# Patient Record
Sex: Male | Born: 1960
Health system: Southern US, Community
[De-identification: ages and names within clinical notes are randomized; demographics above are authoritative.]

## PROBLEM LIST (undated history)

## (undated) DIAGNOSIS — T7840XA Allergy, unspecified, initial encounter: Secondary | ICD-10-CM

## (undated) DIAGNOSIS — E079 Disorder of thyroid, unspecified: Secondary | ICD-10-CM

## (undated) DIAGNOSIS — G4730 Sleep apnea, unspecified: Secondary | ICD-10-CM

## (undated) DIAGNOSIS — E119 Type 2 diabetes mellitus without complications: Secondary | ICD-10-CM

## (undated) DIAGNOSIS — C6290 Malignant neoplasm of unspecified testis, unspecified whether descended or undescended: Secondary | ICD-10-CM

## (undated) HISTORY — DX: Disorder of thyroid, unspecified: E07.9

## (undated) HISTORY — PX: TESTICLE REMOVAL: SHX68

## (undated) HISTORY — PX: POLYPECTOMY: SHX149

## (undated) HISTORY — DX: Sleep apnea, unspecified: G47.30

## (undated) HISTORY — DX: Allergy, unspecified, initial encounter: T78.40XA

## (undated) HISTORY — DX: Type 2 diabetes mellitus without complications: E11.9

## (undated) HISTORY — DX: Malignant neoplasm of unspecified testis, unspecified whether descended or undescended: C62.90

## (undated) HISTORY — PX: WISDOM TOOTH EXTRACTION: SHX21

---

## 2003-04-14 ENCOUNTER — Encounter: Payer: Self-pay | Admitting: *Deleted

## 2003-04-14 ENCOUNTER — Ambulatory Visit (HOSPITAL_COMMUNITY): Admission: RE | Admit: 2003-04-14 | Discharge: 2003-04-14 | Payer: Self-pay | Admitting: *Deleted

## 2003-10-31 DIAGNOSIS — C6290 Malignant neoplasm of unspecified testis, unspecified whether descended or undescended: Secondary | ICD-10-CM

## 2003-10-31 HISTORY — DX: Malignant neoplasm of unspecified testis, unspecified whether descended or undescended: C62.90

## 2005-11-03 ENCOUNTER — Ambulatory Visit (HOSPITAL_COMMUNITY): Admission: RE | Admit: 2005-11-03 | Discharge: 2005-11-03 | Payer: Self-pay | Admitting: Urology

## 2005-11-03 ENCOUNTER — Encounter (INDEPENDENT_AMBULATORY_CARE_PROVIDER_SITE_OTHER): Payer: Self-pay | Admitting: Specialist

## 2005-11-10 ENCOUNTER — Ambulatory Visit: Payer: Self-pay | Admitting: Oncology

## 2005-11-15 ENCOUNTER — Ambulatory Visit: Admission: RE | Admit: 2005-11-15 | Discharge: 2006-01-10 | Payer: Self-pay | Admitting: *Deleted

## 2006-01-04 ENCOUNTER — Ambulatory Visit: Payer: Self-pay | Admitting: Oncology

## 2006-01-05 ENCOUNTER — Ambulatory Visit: Payer: Self-pay | Admitting: Internal Medicine

## 2006-02-02 ENCOUNTER — Ambulatory Visit (HOSPITAL_BASED_OUTPATIENT_CLINIC_OR_DEPARTMENT_OTHER): Admission: RE | Admit: 2006-02-02 | Discharge: 2006-02-02 | Payer: Self-pay | Admitting: Internal Medicine

## 2006-02-02 ENCOUNTER — Encounter: Payer: Self-pay | Admitting: Internal Medicine

## 2006-02-11 ENCOUNTER — Ambulatory Visit: Payer: Self-pay | Admitting: Internal Medicine

## 2006-04-03 ENCOUNTER — Ambulatory Visit (HOSPITAL_COMMUNITY): Admission: RE | Admit: 2006-04-03 | Discharge: 2006-04-03 | Payer: Self-pay | Admitting: Oncology

## 2006-04-18 ENCOUNTER — Ambulatory Visit: Payer: Self-pay | Admitting: Oncology

## 2006-04-18 ENCOUNTER — Ambulatory Visit: Payer: Self-pay | Admitting: Internal Medicine

## 2006-04-20 LAB — CBC WITH DIFFERENTIAL/PLATELET
Basophils Absolute: 0 10*3/uL (ref 0.0–0.1)
Eosinophils Absolute: 0.1 10*3/uL (ref 0.0–0.5)
HGB: 13.7 g/dL (ref 13.0–17.1)
NEUT#: 3 10*3/uL (ref 1.5–6.5)
RBC: 4.47 10*6/uL (ref 4.20–5.71)
RDW: 11.1 % — ABNORMAL LOW (ref 11.2–14.6)
WBC: 4.4 10*3/uL (ref 4.0–10.0)
lymph#: 1 10*3/uL (ref 0.9–3.3)

## 2006-04-23 LAB — COMPREHENSIVE METABOLIC PANEL
AST: 16 U/L (ref 0–37)
Albumin: 4.2 g/dL (ref 3.5–5.2)
Alkaline Phosphatase: 74 U/L (ref 39–117)
BUN: 19 mg/dL (ref 6–23)
Calcium: 9.3 mg/dL (ref 8.4–10.5)
Chloride: 106 mEq/L (ref 96–112)
Glucose, Bld: 113 mg/dL — ABNORMAL HIGH (ref 70–99)
Potassium: 4.3 mEq/L (ref 3.5–5.3)
Sodium: 141 mEq/L (ref 135–145)
Total Protein: 7.1 g/dL (ref 6.0–8.3)

## 2006-04-23 LAB — AFP TUMOR MARKER: AFP-Tumor Marker: 2.3 ng/mL (ref 0.0–8.0)

## 2006-06-08 ENCOUNTER — Ambulatory Visit: Payer: Self-pay | Admitting: Internal Medicine

## 2006-07-18 ENCOUNTER — Ambulatory Visit: Payer: Self-pay | Admitting: Oncology

## 2006-07-20 LAB — CBC WITH DIFFERENTIAL/PLATELET
BASO%: 0.3 % (ref 0.0–2.0)
EOS%: 1.5 % (ref 0.0–7.0)
HCT: 38 % — ABNORMAL LOW (ref 38.7–49.9)
LYMPH%: 18.5 % (ref 14.0–48.0)
MCH: 29.9 pg (ref 28.0–33.4)
MCHC: 34.6 g/dL (ref 32.0–35.9)
NEUT%: 70.3 % (ref 40.0–75.0)
Platelets: 217 10*3/uL (ref 145–400)

## 2006-07-22 LAB — COMPREHENSIVE METABOLIC PANEL
ALT: 24 U/L (ref 0–40)
AST: 16 U/L (ref 0–37)
Creatinine, Ser: 1.15 mg/dL (ref 0.40–1.50)
Total Bilirubin: 0.7 mg/dL (ref 0.3–1.2)

## 2006-07-22 LAB — LACTATE DEHYDROGENASE: LDH: 131 U/L (ref 94–250)

## 2006-10-12 ENCOUNTER — Ambulatory Visit (HOSPITAL_COMMUNITY): Admission: RE | Admit: 2006-10-12 | Discharge: 2006-10-12 | Payer: Self-pay | Admitting: Oncology

## 2006-10-12 ENCOUNTER — Ambulatory Visit: Payer: Self-pay | Admitting: Oncology

## 2006-10-16 LAB — CBC WITH DIFFERENTIAL/PLATELET
BASO%: 1 % (ref 0.0–2.0)
EOS%: 1.7 % (ref 0.0–7.0)
LYMPH%: 17.2 % (ref 14.0–48.0)
MCH: 29.3 pg (ref 28.0–33.4)
MCHC: 34.5 g/dL (ref 32.0–35.9)
MONO#: 0.5 10*3/uL (ref 0.1–0.9)
NEUT%: 71.1 % (ref 40.0–75.0)
RBC: 4.67 10*6/uL (ref 4.20–5.71)
WBC: 5 10*3/uL (ref 4.0–10.0)
lymph#: 0.9 10*3/uL (ref 0.9–3.3)

## 2006-10-18 LAB — LACTATE DEHYDROGENASE: LDH: 122 U/L (ref 94–250)

## 2006-10-18 LAB — COMPREHENSIVE METABOLIC PANEL
ALT: 25 U/L (ref 0–53)
AST: 19 U/L (ref 0–37)
CO2: 24 mEq/L (ref 19–32)
Chloride: 106 mEq/L (ref 96–112)
Creatinine, Ser: 1.11 mg/dL (ref 0.40–1.50)
Sodium: 140 mEq/L (ref 135–145)
Total Bilirubin: 0.7 mg/dL (ref 0.3–1.2)
Total Protein: 7.4 g/dL (ref 6.0–8.3)

## 2006-11-16 ENCOUNTER — Ambulatory Visit: Payer: Self-pay | Admitting: Internal Medicine

## 2006-11-16 LAB — CONVERTED CEMR LAB
Albumin: 3.6 g/dL (ref 3.5–5.2)
BUN: 17 mg/dL (ref 6–23)
Basophils Absolute: 0 10*3/uL (ref 0.0–0.1)
Cholesterol: 166 mg/dL (ref 0–200)
Eosinophils Relative: 1.5 % (ref 0.0–5.0)
GFR calc Af Amer: 93 mL/min
GFR calc non Af Amer: 77 mL/min
HCT: 39.5 % (ref 39.0–52.0)
Hgb A1c MFr Bld: 6.1 % — ABNORMAL HIGH (ref 4.6–6.0)
Lymphocytes Relative: 18 % (ref 12.0–46.0)
MCHC: 33.4 g/dL (ref 30.0–36.0)
MCV: 88 fL (ref 78.0–100.0)
Neutro Abs: 2.9 10*3/uL (ref 1.4–7.7)
Neutrophils Relative %: 71.6 % (ref 43.0–77.0)
Potassium: 4 meq/L (ref 3.5–5.1)
RBC: 4.49 M/uL (ref 4.22–5.81)
Sodium: 142 meq/L (ref 135–145)
Total CHOL/HDL Ratio: 4.8
Triglycerides: 80 mg/dL (ref 0–149)
WBC: 4.6 10*3/uL (ref 4.5–10.5)

## 2006-12-14 ENCOUNTER — Ambulatory Visit: Payer: Self-pay | Admitting: Internal Medicine

## 2007-01-15 ENCOUNTER — Ambulatory Visit: Payer: Self-pay | Admitting: Oncology

## 2007-01-31 LAB — CBC WITH DIFFERENTIAL/PLATELET
Eosinophils Absolute: 0.1 10*3/uL (ref 0.0–0.5)
MONO#: 0.5 10*3/uL (ref 0.1–0.9)
NEUT#: 4.4 10*3/uL (ref 1.5–6.5)
Platelets: 198 10*3/uL (ref 145–400)
RBC: 4.45 10*6/uL (ref 4.20–5.71)
RDW: 12.6 % (ref 11.2–14.6)
WBC: 5.9 10*3/uL (ref 4.0–10.0)

## 2007-02-01 ENCOUNTER — Ambulatory Visit: Payer: Self-pay | Admitting: Internal Medicine

## 2007-02-03 LAB — COMPREHENSIVE METABOLIC PANEL
ALT: 26 U/L (ref 0–53)
AST: 16 U/L (ref 0–37)
BUN: 24 mg/dL — ABNORMAL HIGH (ref 6–23)
Calcium: 9 mg/dL (ref 8.4–10.5)
Chloride: 106 mEq/L (ref 96–112)
Creatinine, Ser: 1.15 mg/dL (ref 0.40–1.50)
Total Bilirubin: 0.7 mg/dL (ref 0.3–1.2)

## 2007-02-03 LAB — LACTATE DEHYDROGENASE: LDH: 124 U/L (ref 94–250)

## 2007-02-03 LAB — BETA HCG QUANT (REF LAB): Beta hCG, Tumor Marker: <0.5 m[IU]/mL (ref <5.0)

## 2007-04-25 ENCOUNTER — Ambulatory Visit (HOSPITAL_COMMUNITY): Admission: RE | Admit: 2007-04-25 | Discharge: 2007-04-25 | Payer: Self-pay | Admitting: Oncology

## 2007-05-08 ENCOUNTER — Ambulatory Visit: Payer: Self-pay | Admitting: Oncology

## 2007-05-10 LAB — CBC WITH DIFFERENTIAL/PLATELET
BASO%: 0.5 % (ref 0.0–2.0)
Eosinophils Absolute: 0.1 10*3/uL (ref 0.0–0.5)
MCHC: 34.8 g/dL (ref 32.0–35.9)
MONO#: 0.5 10*3/uL (ref 0.1–0.9)
NEUT#: 4.1 10*3/uL (ref 1.5–6.5)
RBC: 4.43 10*6/uL (ref 4.20–5.71)
WBC: 5.6 10*3/uL (ref 4.0–10.0)
lymph#: 0.9 10*3/uL (ref 0.9–3.3)

## 2007-05-13 LAB — BETA HCG QUANT (REF LAB): Beta hCG, Tumor Marker: <0.5 m[IU]/mL (ref <5.0)

## 2007-05-13 LAB — COMPREHENSIVE METABOLIC PANEL
BUN: 22 mg/dL (ref 6–23)
CO2: 24 mEq/L (ref 19–32)
Calcium: 8.9 mg/dL (ref 8.4–10.5)
Chloride: 105 mEq/L (ref 96–112)
Creatinine, Ser: 1.12 mg/dL (ref 0.40–1.50)

## 2007-05-13 LAB — LACTATE DEHYDROGENASE: LDH: 118 U/L (ref 94–250)

## 2007-11-08 ENCOUNTER — Ambulatory Visit (HOSPITAL_COMMUNITY): Admission: RE | Admit: 2007-11-08 | Discharge: 2007-11-08 | Payer: Self-pay | Admitting: Oncology

## 2007-11-13 ENCOUNTER — Ambulatory Visit: Payer: Self-pay | Admitting: Oncology

## 2007-11-15 LAB — CBC WITH DIFFERENTIAL/PLATELET
BASO%: 0.3 % (ref 0.0–2.0)
LYMPH%: 15.6 % (ref 14.0–48.0)
MCHC: 34 g/dL (ref 32.0–35.9)
MONO#: 0.4 10*3/uL (ref 0.1–0.9)
RBC: 4.6 10*6/uL (ref 4.20–5.71)
RDW: 10.8 % — ABNORMAL LOW (ref 11.2–14.6)
WBC: 5.3 10*3/uL (ref 4.0–10.0)
lymph#: 0.8 10*3/uL — ABNORMAL LOW (ref 0.9–3.3)

## 2007-11-17 LAB — BETA HCG QUANT (REF LAB): Beta hCG, Tumor Marker: <0.5 m[IU]/mL (ref <5.0)

## 2007-11-17 LAB — COMPREHENSIVE METABOLIC PANEL
ALT: 26 U/L (ref 0–53)
CO2: 26 mEq/L (ref 19–32)
Chloride: 105 mEq/L (ref 96–112)
Potassium: 4 mEq/L (ref 3.5–5.3)
Sodium: 141 mEq/L (ref 135–145)
Total Bilirubin: 0.6 mg/dL (ref 0.3–1.2)
Total Protein: 7.2 g/dL (ref 6.0–8.3)

## 2007-11-17 LAB — AFP TUMOR MARKER: AFP-Tumor Marker: 1.5 ng/mL (ref 0.0–8.0)

## 2008-01-06 ENCOUNTER — Ambulatory Visit: Payer: Self-pay | Admitting: Internal Medicine

## 2008-01-06 DIAGNOSIS — Z8547 Personal history of malignant neoplasm of testis: Secondary | ICD-10-CM

## 2008-01-06 DIAGNOSIS — R7309 Other abnormal glucose: Secondary | ICD-10-CM

## 2008-01-06 DIAGNOSIS — G4733 Obstructive sleep apnea (adult) (pediatric): Secondary | ICD-10-CM

## 2008-01-06 DIAGNOSIS — M25519 Pain in unspecified shoulder: Secondary | ICD-10-CM

## 2008-01-15 ENCOUNTER — Encounter: Payer: Self-pay | Admitting: Internal Medicine

## 2008-01-29 ENCOUNTER — Encounter: Payer: Self-pay | Admitting: Internal Medicine

## 2008-03-06 ENCOUNTER — Ambulatory Visit: Payer: Self-pay | Admitting: Internal Medicine

## 2008-04-15 ENCOUNTER — Ambulatory Visit: Payer: Self-pay | Admitting: Oncology

## 2008-04-17 ENCOUNTER — Ambulatory Visit (HOSPITAL_COMMUNITY): Admission: RE | Admit: 2008-04-17 | Discharge: 2008-04-17 | Payer: Self-pay | Admitting: Oncology

## 2008-04-24 LAB — CBC WITH DIFFERENTIAL/PLATELET
Basophils Absolute: 0 10*3/uL (ref 0.0–0.1)
EOS%: 1 % (ref 0.0–7.0)
HGB: 13.6 g/dL (ref 13.0–17.1)
MCH: 29.4 pg (ref 28.0–33.4)
MCV: 84.9 fL (ref 81.6–98.0)
MONO%: 5.6 % (ref 0.0–13.0)
NEUT%: 78.8 % — ABNORMAL HIGH (ref 40.0–75.0)
RDW: 13.2 % (ref 11.2–14.6)

## 2008-04-26 LAB — COMPREHENSIVE METABOLIC PANEL
ALT: 27 U/L (ref 0–53)
Albumin: 4.2 g/dL (ref 3.5–5.2)
Calcium: 9 mg/dL (ref 8.4–10.5)
Potassium: 4 mEq/L (ref 3.5–5.3)
Total Bilirubin: 0.7 mg/dL (ref 0.3–1.2)
Total Protein: 7 g/dL (ref 6.0–8.3)

## 2008-04-26 LAB — AFP TUMOR MARKER: AFP-Tumor Marker: 2.3 ng/mL (ref 0.0–8.0)

## 2008-04-26 LAB — BETA HCG QUANT (REF LAB): Beta hCG, Tumor Marker: <0.5 m[IU]/mL (ref <5.0)

## 2008-04-26 LAB — LACTATE DEHYDROGENASE: LDH: 148 U/L (ref 94–250)

## 2008-05-15 ENCOUNTER — Encounter: Payer: Self-pay | Admitting: Internal Medicine

## 2008-05-29 ENCOUNTER — Telehealth: Payer: Self-pay | Admitting: Internal Medicine

## 2008-09-30 ENCOUNTER — Ambulatory Visit: Payer: Self-pay | Admitting: Oncology

## 2008-10-02 LAB — CBC WITH DIFFERENTIAL/PLATELET
BASO%: 0.3 % (ref 0.0–2.0)
EOS%: 1.9 % (ref 0.0–7.0)
MCH: 29.8 pg (ref 28.0–33.4)
MCHC: 34.5 g/dL (ref 32.0–35.9)
RBC: 4.69 10*6/uL (ref 4.20–5.71)
RDW: 13.2 % (ref 11.2–14.6)
lymph#: 0.8 10*3/uL — ABNORMAL LOW (ref 0.9–3.3)

## 2008-10-06 LAB — COMPREHENSIVE METABOLIC PANEL
ALT: 21 U/L (ref 0–53)
AST: 15 U/L (ref 0–37)
Albumin: 4.3 g/dL (ref 3.5–5.2)
Calcium: 9.2 mg/dL (ref 8.4–10.5)
Chloride: 105 mEq/L (ref 96–112)
Potassium: 4.1 mEq/L (ref 3.5–5.3)

## 2009-01-07 ENCOUNTER — Ambulatory Visit: Payer: Self-pay | Admitting: Internal Medicine

## 2009-01-07 DIAGNOSIS — J069 Acute upper respiratory infection, unspecified: Secondary | ICD-10-CM | POA: Insufficient documentation

## 2009-04-07 ENCOUNTER — Ambulatory Visit: Payer: Self-pay | Admitting: Oncology

## 2009-04-09 ENCOUNTER — Ambulatory Visit (HOSPITAL_COMMUNITY): Admission: RE | Admit: 2009-04-09 | Discharge: 2009-04-09 | Payer: Self-pay | Admitting: Oncology

## 2009-04-09 LAB — CBC WITH DIFFERENTIAL/PLATELET
BASO%: 0.2 % (ref 0.0–2.0)
Basophils Absolute: 0 10*3/uL (ref 0.0–0.1)
EOS%: 1.3 % (ref 0.0–7.0)
MCH: 30.2 pg (ref 27.2–33.4)
MCHC: 35 g/dL (ref 32.0–36.0)
MCV: 86.3 fL (ref 79.3–98.0)
MONO%: 6.4 % (ref 0.0–14.0)
RBC: 4.7 10*6/uL (ref 4.20–5.82)
RDW: 13.7 % (ref 11.0–14.6)
lymph#: 0.9 10*3/uL (ref 0.9–3.3)

## 2009-04-11 LAB — COMPREHENSIVE METABOLIC PANEL
ALT: 26 U/L (ref 0–53)
AST: 17 U/L (ref 0–37)
Albumin: 3.9 g/dL (ref 3.5–5.2)
Alkaline Phosphatase: 70 U/L (ref 39–117)
BUN: 17 mg/dL (ref 6–23)
Potassium: 4.4 mEq/L (ref 3.5–5.3)
Sodium: 142 mEq/L (ref 135–145)

## 2009-04-11 LAB — AFP TUMOR MARKER: AFP-Tumor Marker: 2.9 ng/mL (ref 0.0–8.0)

## 2009-04-11 LAB — BETA HCG QUANT (REF LAB): Beta hCG, Tumor Marker: <0.5 m[IU]/mL (ref <5.0)

## 2009-04-28 ENCOUNTER — Ambulatory Visit (HOSPITAL_COMMUNITY): Admission: RE | Admit: 2009-04-28 | Discharge: 2009-04-28 | Payer: Self-pay | Admitting: Oncology

## 2009-10-13 ENCOUNTER — Ambulatory Visit: Payer: Self-pay | Admitting: Oncology

## 2009-10-15 LAB — CBC WITH DIFFERENTIAL/PLATELET
EOS%: 1.4 % (ref 0.0–7.0)
Eosinophils Absolute: 0.1 10*3/uL (ref 0.0–0.5)
MCV: 88.3 fL (ref 79.3–98.0)
MONO%: 9.1 % (ref 0.0–14.0)
NEUT#: 4.7 10*3/uL (ref 1.5–6.5)
RBC: 4.46 10*6/uL (ref 4.20–5.82)
RDW: 13.2 % (ref 11.0–14.6)
WBC: 6.5 10*3/uL (ref 4.0–10.3)
lymph#: 1.1 10*3/uL (ref 0.9–3.3)

## 2009-10-17 LAB — AFP TUMOR MARKER: AFP-Tumor Marker: 2.6 ng/mL (ref 0.0–8.0)

## 2009-10-17 LAB — COMPREHENSIVE METABOLIC PANEL
AST: 14 U/L (ref 0–37)
Albumin: 4.1 g/dL (ref 3.5–5.2)
Alkaline Phosphatase: 78 U/L (ref 39–117)
Glucose, Bld: 160 mg/dL — ABNORMAL HIGH (ref 70–99)
Potassium: 4 mEq/L (ref 3.5–5.3)
Sodium: 139 mEq/L (ref 135–145)
Total Protein: 7.4 g/dL (ref 6.0–8.3)

## 2009-10-17 LAB — BETA HCG QUANT (REF LAB): Beta hCG, Tumor Marker: <0.5 m[IU]/mL (ref <5.0)

## 2010-01-20 ENCOUNTER — Ambulatory Visit: Payer: Self-pay | Admitting: Vascular Surgery

## 2010-04-08 ENCOUNTER — Ambulatory Visit (HOSPITAL_COMMUNITY): Admission: RE | Admit: 2010-04-08 | Discharge: 2010-04-08 | Payer: Self-pay | Admitting: Oncology

## 2010-04-13 ENCOUNTER — Ambulatory Visit: Payer: Self-pay | Admitting: Oncology

## 2010-04-15 LAB — CBC WITH DIFFERENTIAL/PLATELET
Basophils Absolute: 0 10*3/uL (ref 0.0–0.1)
Eosinophils Absolute: 0.1 10*3/uL (ref 0.0–0.5)
HCT: 38.5 % (ref 38.4–49.9)
HGB: 13.1 g/dL (ref 13.0–17.1)
LYMPH%: 20.4 % (ref 14.0–49.0)
MCH: 29.5 pg (ref 27.2–33.4)
MCV: 86.6 fL (ref 79.3–98.0)
MONO%: 11.4 % (ref 0.0–14.0)
NEUT#: 3.6 10*3/uL (ref 1.5–6.5)
NEUT%: 66.2 % (ref 39.0–75.0)
Platelets: 219 10*3/uL (ref 140–400)
RDW: 13.8 % (ref 11.0–14.6)

## 2010-04-20 LAB — COMPREHENSIVE METABOLIC PANEL
Albumin: 4 g/dL (ref 3.5–5.2)
Alkaline Phosphatase: 79 U/L (ref 39–117)
BUN: 20 mg/dL (ref 6–23)
Creatinine, Ser: 1.02 mg/dL (ref 0.40–1.50)
Glucose, Bld: 130 mg/dL — ABNORMAL HIGH (ref 70–99)
Potassium: 4.3 mEq/L (ref 3.5–5.3)

## 2010-04-20 LAB — BETA HCG QUANT (REF LAB): Beta hCG, Tumor Marker: <0.5 m[IU]/mL (ref <5.0)

## 2010-04-20 LAB — AFP TUMOR MARKER: AFP-Tumor Marker: 2 ng/mL (ref 0.0–8.0)

## 2010-10-12 ENCOUNTER — Ambulatory Visit: Payer: Self-pay | Admitting: Oncology

## 2010-10-20 LAB — CBC WITH DIFFERENTIAL/PLATELET
Basophils Absolute: 0 10*3/uL (ref 0.0–0.1)
EOS%: 1.8 % (ref 0.0–7.0)
Eosinophils Absolute: 0.1 10*3/uL (ref 0.0–0.5)
HCT: 39.7 % (ref 38.4–49.9)
HGB: 13.7 g/dL (ref 13.0–17.1)
MCH: 29.9 pg (ref 27.2–33.4)
MONO#: 0.4 10*3/uL (ref 0.1–0.9)
NEUT#: 3 10*3/uL (ref 1.5–6.5)
NEUT%: 64.5 % (ref 39.0–75.0)
RDW: 13.4 % (ref 11.0–14.6)
lymph#: 1.1 10*3/uL (ref 0.9–3.3)

## 2010-10-25 LAB — COMPREHENSIVE METABOLIC PANEL
AST: 23 U/L (ref 0–37)
Albumin: 4.1 g/dL (ref 3.5–5.2)
BUN: 18 mg/dL (ref 6–23)
CO2: 22 mEq/L (ref 19–32)
Calcium: 9.3 mg/dL (ref 8.4–10.5)
Chloride: 103 mEq/L (ref 96–112)
Creatinine, Ser: 1.01 mg/dL (ref 0.40–1.50)
Glucose, Bld: 152 mg/dL — ABNORMAL HIGH (ref 70–99)
Potassium: 4.1 mEq/L (ref 3.5–5.3)

## 2010-10-25 LAB — AFP TUMOR MARKER: AFP-Tumor Marker: 3.1 ng/mL (ref 0.0–8.0)

## 2010-10-25 LAB — BETA HCG QUANT (REF LAB): Beta hCG, Tumor Marker: 32.3 m[IU]/mL — ABNORMAL HIGH (ref <5.0)

## 2010-10-25 LAB — LACTATE DEHYDROGENASE: LDH: 115 U/L (ref 94–250)

## 2010-10-28 ENCOUNTER — Ambulatory Visit (HOSPITAL_COMMUNITY)
Admission: RE | Admit: 2010-10-28 | Discharge: 2010-10-28 | Payer: Self-pay | Source: Home / Self Care | Attending: Oncology | Admitting: Oncology

## 2010-11-20 ENCOUNTER — Encounter: Payer: Self-pay | Admitting: *Deleted

## 2011-02-20 ENCOUNTER — Encounter (HOSPITAL_BASED_OUTPATIENT_CLINIC_OR_DEPARTMENT_OTHER): Payer: BC Managed Care – PPO | Admitting: Oncology

## 2011-02-20 ENCOUNTER — Other Ambulatory Visit: Payer: Self-pay | Admitting: Oncology

## 2011-02-20 DIAGNOSIS — C6290 Malignant neoplasm of unspecified testis, unspecified whether descended or undescended: Secondary | ICD-10-CM

## 2011-02-20 LAB — CBC WITH DIFFERENTIAL/PLATELET
BASO%: 0 % (ref 0.0–2.0)
EOS%: 1.4 % (ref 0.0–7.0)
HCT: 39.9 % (ref 38.4–49.9)
LYMPH%: 15.5 % (ref 14.0–49.0)
MCH: 29.3 pg (ref 27.2–33.4)
MCHC: 33.5 g/dL (ref 32.0–36.0)
MONO#: 0.5 10*3/uL (ref 0.1–0.9)
NEUT%: 75.4 % — ABNORMAL HIGH (ref 39.0–75.0)
RBC: 4.56 10*6/uL (ref 4.20–5.82)
WBC: 7 10*3/uL (ref 4.0–10.3)
lymph#: 1.1 10*3/uL (ref 0.9–3.3)

## 2011-02-20 LAB — COMPREHENSIVE METABOLIC PANEL
ALT: 20 U/L (ref 0–53)
AST: 20 U/L (ref 0–37)
Chloride: 108 mEq/L (ref 96–112)
Creatinine, Ser: 1.06 mg/dL (ref 0.40–1.50)
Sodium: 140 mEq/L (ref 135–145)
Total Bilirubin: 0.7 mg/dL (ref 0.3–1.2)
Total Protein: 7.4 g/dL (ref 6.0–8.3)

## 2011-02-22 LAB — AFP TUMOR MARKER: AFP-Tumor Marker: 2.5 ng/mL (ref 0.0–8.0)

## 2011-02-22 LAB — BETA HCG QUANT (REF LAB): Beta hCG, Tumor Marker: <0.5 m[IU]/mL (ref <5.0)

## 2011-03-14 NOTE — Procedures (Signed)
DUPLEX DEEP VENOUS EXAM - LOWER EXTREMITY   INDICATION:  Left lower extremity pain and swelling since Monday after a  long drive this weekend.   HISTORY:  Edema:  Left lower extremity.  Trauma/Surgery:  No.  Pain:  Left lower extremity.  PE:  No.  Previous DVT:  No.  Anticoagulants:  Had shot yesterday, per patient.  Other:   DUPLEX EXAM:                CFV   SFV   PopV  PTV    GSV                R  L  R  L  R  L  R   L  R  L  Thrombosis    o  o     o     o      o     o  Spontaneous   +  +     +     +      +     +  Phasic        +  +     +     +      +     +  Augmentation  +  +     +     +      +     +  Compressible  +  +     +     +      +     +  Competent     +  +     +     +      +     +   Legend:  + - yes  o - no  p - partial  D - decreased   IMPRESSION:  No evidence of deep or superficial thrombosis in the left  lower extremity or right common femoral vein.   Results were called over to [facility] and then faxed.    _____________________________  Di Kindle. Edilia Bo, M.D.   AS/MEDQ  D:  01/20/2010  T:  01/21/2010  Job:  [id number]

## 2011-03-17 NOTE — Procedures (Signed)
Nathaniel Bonilla, Nathaniel Bonilla                ACCOUNT NO.:  [id number]   MEDICAL RECORD NO.:  [id number]          PATIENT TYPE:  OUT   LOCATION:  SLEEP CENTER                 FACILITY:  [facility]   PHYSICIAN:  Clinton D. Maple Hudson, M.D. DATE OF BIRTH:  1961/02/15   DATE OF STUDY:  02/02/2006                              NOCTURNAL POLYSOMNOGRAM   INDICATION FOR STUDY:  Hypersomnia with sleep apnea.  BMI 42.8.   EPWORTH SLEEPINESS SCORE:  11/24.   WEIGHT:  300 pounds.   HOME MEDICATIONS:  None listed.   RESPIRATORY DATA:  Total sleep time 340 minutes with sleep efficiency 86%.  Stage I was 17%, stage II 64%, stages III and IV 11%, REM 9% of total sleep  time.  Sleep latency 11 minutes, REM latency 214 minutes, awake after sleep  onset 47 minutes, arousal index of 17.  No bedtime medication taken.   RESPIRATORY DATA:  Split study protocol.  Apnea/hypopnea index (AHI, RDI)  28.8 obstructive events per hour indicating moderate obstructive sleep  apnea/hypopnea syndrome before C pap.  This included 5 obstructive apneas  and 55 hypopneas before CPAP .  Events for more frequent while sleeping  supine, but present in all sleep positions.  REM HPI 2 per hour.  CPAP was  titrated to 13 CWP, AHI  2.4/h.  A small Respironics Comfort Gel nasal mask was used with a heated  humidifier and well tolerated.   OXYGEN DATA:  Moderately loud snoring before CPAP with oxygen desaturation  to a nadir of 58%.  After CPAP control saturation held 94-96% on room air.   CARDIAC DATA:  Normal sinus rhythm.   MOVEMENT/PARASOMNIA:  Rather frequent leg jerks with a total of 100 counted  but insignificant effect on sleep quality with a periodic limb movement with  arousal index of 1.8 per hour.  Restlessness noted mainly in the first half  of the night before CPAP.  Bathroom x1.   IMPRESSION/RECOMMENDATION:  1.  Moderate obstructive sleep apnea/hypopnea syndrome, AHI 28.8/h. with      events more common while supine.  Moderate  snoring and oxygen      desaturation to 58%.  2.  Successful CPAP titration to 13 CWP, AHI 2.4/h.  A small Respironics      Comfort Gel nasal mask was used      with a heated humidifier.  3.  Mild periodic limb movement with arousal.      Clinton D. Maple Hudson, M.D.  Diplomate, Biomedical engineer of Sleep Medicine  Electronically Signed     CDY/MEDQ  D:  02/11/2006 13:29:53  T:  02/12/2006 08:26:20  Job:  [id number]

## 2011-03-17 NOTE — Assessment & Plan Note (Signed)
[facility]                               PULMONARY OFFICE NOTE   ESA, RADEN                         MRN:          [id number]  DATE:06/08/2006                            DOB:          May 27, 1961    PROBLEMS:  1. Obstructive sleep apnea with hypersomnia.  2. Remote history of testicular cancer/radiation therapy.   HISTORY:  He returns now after a 3-month home trial of CPAP at 13 cwp  through [facility].  He says the pressure seems comfortable, and  he changed masks to one that he is satisfied with.  He notices that he still  feels sleepier during the day then he had hoped he would.  He admits to job  stress, and we have discussed sleep hygiene again.  He does not think a  thyroid function has ever been checked.   MEDICATIONS:  No routine medications.   DRUG INTOLERANCE:  GUAIFENESIN.   CPAP:  13 cwp.   OBJECTIVE:  This is a heavy-set man.  Weight 295 pounds, blood pressure  120/70, pulse regular and 67, room air saturation 97%.  He is alert, and  neurologic is unremarkable to observation.  Thyroid does not seem enlarged.  There is no significant nasal congestion or stridor.  No neck vein  distention.  He has some dark circles under his eyes.   IMPRESSION:  1. Obstructive sleep apnea with good initial adjustment to home CPAP at 13      cwp.  2. Exogenous obesity.  3. Residual daytime tiredness.   PLAN:  1. Weight loss.  2. Continue CPAP at 13 cwp.  3. Sleep hygiene emphasized, including an occasional short nap when      possible.  4. Reminded of responsibility to drive safely.  5. Thyroid function check to exclude hypothyroidism (this returned by      dictation normal with a TSH of 3.54 and free T4 of 0.8).  6. Sample of Provigil 200 mg every morning p.r.n. with medication      discussion.  Compare this      to caffeine.  7. Schedule a return in 4 months, earlier p.r.n.                                   Clinton D.  Maple Hudson, MD, FCCP, FACP   CDY/MedQ  DD:  06/10/2006  DT:  06/11/2006  Job #:  [id number]

## 2011-03-17 NOTE — Op Note (Signed)
NAMEVENSON, FERENCZ                ACCOUNT NO.:  [id number]   MEDICAL RECORD NO.:  [id number]          PATIENT TYPE:  AMB   LOCATION:  DAY                          FACILITY:  [facility]   PHYSICIAN:  Courtney Paris, M.D.DATE OF BIRTH:  1961/02/19   DATE OF PROCEDURE:  11/03/2005  DATE OF DISCHARGE:                                 OPERATIVE REPORT   PREOPERATIVE DIAGNOSIS:  Left testicular mass.   POSTOPERATIVE DIAGNOSIS:  Left testicular mass.   PROCEDURE:  Radical orchiectomy.   SURGEON:  Courtney Paris, M.D.   ASSISTANT:  Glade Nurse, M.D.   ANESTHESIA:  General endotracheal with local.   SPECIMEN:  Left testis and epididymis with cord.   DRAINS:  None.   DESCRIPTION OF PROCEDURE:  The patient was identified by his wrist bracelet  and brought to room 6 where he received preprocedure antibiotics and was  administered general anesthesia. He was prepped and draped in the usual  sterile fashion taking great care to minimize the risk of peripheral  neuropathy or compartment syndrome. Next we examined the patient's  extremities and noted a large left mass located over the superior pole of  the testis with infiltration into the head of the epididymis. Next we made a  6 cm left inguinal incision. We dissected down through the dermis,  subcutaneous fat and Scarpa's fascia with Bovie cautery. Hemostasis was  maintained. Next using blunt dissection, we cleaned the fat over the  external oblique, we identified the external ring and exiting cord. Next we  used a 15 blade knife to make an incision approximately 1 cm proximal to the  external ring tangential to the fibers of the external oblique. We grasped  each leaf of the excised fascia and dissected the underlying tissue  including the ilioinguinal nerve with Kitner device. Next, we continued to  open the fascia proximally and distally to the ring in approximately 3 cm  proximally. Next we continued to use a Kitner to  completely free the fascia  from the underlying cord structure dropping the nerve which was kept in  plain site throughout this dissection. Next, we bluntly circumferentially  dissected out the cord and brought it in the field. We placed a 1/2 cm  Penrose drain around it. Next we used a combination of blunt and sharp  dissection to dissect the cord structure free distally. The scrotum was  inverted and the gubernaculum was divided with Bovie cautery. Excellent  hemostasis was noted. Next, we dissected the cord back to the internal ring  which was marked by the preperitoneal fat, we divided the cord into 2  packets which were clamped with Kelly clamps. Each packet was then ligated  with a #0 silk suture tie as well as a #0 silk free tie. Approximately 1-2  cm tags were left on these ties. Next, we passed off the specimen to be  examined for pathologic analysis. We thoroughly inspected the wound and it  was found to be hemostatic. Following this, we proceeded to close the defect  in the external oblique with interrupted #4 silk sutures. The  ilioinguinal  nerve was not involved in the closure of the fascia. Next, we closed  Scarpa's fascia with a running 4-0 Vicryl. Following this, 10 mL of 0.5%  Marcaine with epinephrine were instilled to provide for analgesia. Following  this, we closed the skin  with a running 4-0 subcuticular Monocryl, dressings as well as scrotal  support was applied. The patient was reversed from his anesthesia which he  tolerated without complication. Please note Courtney Paris, M.D. was  present and participated in all aspects of this case.     ______________________________  Glade Nurse, MD      Courtney Paris, M.D.  Electronically Signed    MT/MEDQ  D:  11/03/2005  T:  11/03/2005  Job:  [id number]

## 2011-03-17 NOTE — Assessment & Plan Note (Signed)
[facility]                           PRIMARY CARE OFFICE NOTE   Nathaniel Bonilla, SOBECKI                         MRN:          [id number]  DATE:11/16/2006                            DOB:          Oct 16, 1961    CHIEF COMPLAINT:  New patient to practice.   HISTORY OF PRESENT ILLNESS:  The patient is a 50 year old white male,  here to establish for primary care.  He has been followed by [facility] in the past.  He has some notable recent medical  history, including recent diagnosis of testicular cancer in 2007.  He is  followed by his oncologist, Dr. Clelia Croft.  His left testicle was removed  and he underwent also followup adjuvant radiation therapy.  He has had  followup CT scans of his abdomen and pelvis, which have not noted any  evidence of metastatic disease.  He is also followed by Dr. Maple Hudson, who  has been evaluating and treating obstructive sleep apnea.  He states  that he does use C-PAP on a regular basis.  He has struggled with his  weight over the last several years.  His current weight is 299 pounds.  He has had limited success with increasing his physical activity.   He does have a family history of type 2 diabetes.  His brother is  diabetic.  It has been some time since he has had any blood work to  check for diabetes.  He denies any history of stroke or heart disease.   SOCIAL HISTORY:  The patient is married.  He works as an Pensions consultant.  He  has two children, ages 76 and 48.  He denies any alcohol or tobacco.   FAMILY HISTORY:  Mother is alive at age 20, has glaucoma, some issues  with gout.  Father deceased at age 38, secondary to heart disease.  He  died undergoing CABG.  His father is noted to have had a stroke in his  30s.   REVIEW OF SYSTEMS:  No fevers, chills, no HEENT symptoms.  The patient,  in the remote past, three years ago, had chest pain, underwent workup,  including 2D echo, which was unremarkable.  He does note  chronic  heartburn type symptoms, no nausea or vomiting.  The patient has  frequent bloating sensation, no polyuria or polydipsia, and all other  systems negative.   PHYSICAL EXAMINATION:  VITALS:  Weight is 299 pounds, temperature is  97.5, pulse is 69, BP is 118/76 in the left arm in seated position.  IN GENERAL:  The patient is a very pleasant, obese, 50 year old [place]  male, in no apparent distress.  HEENT:  Normocephalic, atraumatic.  Pupils are equal and reactive to  light bilaterally.  Extraocular muscles are intact.  The patient is  anicteric.  Conjunctiva was within normal limits.  External auditory  canals and tympanic membranes were clear bilaterally.  Oropharyngeal  exam reveals crowded upper airway, otherwise unremarkable.  NECK:  Thick.  No thyromegaly or carotid bruit.  Positive acanthosis  nigricans.  CHEST EXAM:  Normal respiratory effort.  Chest is  clear to auscultation  bilaterally, no rhonchi, rales or wheezing.  CARDIOVASCULAR:  Regular rate and rhythm, no significant murmurs, rubs  or gallops appreciated.  ABDOMEN:  Quite protuberant, nontender, positive bowel sounds.  Unable  to appreciate organomegaly.  MUSCULOSKELETAL EXAM:  No clubbing, cyanosis or edema.  The patient has  stasis dermatitis changes of his lower extremities, but intact pedis  dorsalis pulses.  NEUROLOGICALLY:  Cranial nerves II through XII were grossly intact.  He  was nonfocal.   IMPRESSION/RECOMMENDATIONS:  1. Morbid obesity with family history of type 2 diabetes.  2. History of obstructive sleep apnea, on C-PAP.  3. History of left testicular mass/testicular cancer, status post      radical orchiectomy.  4. Chronic abdominal bloating.  5. Health maintenance.   RECOMMENDATIONS:  The patient will be sent for screening labs, including  hemoglobin A1c and fasting blood sugar.  The patient has already  physical signs of insulin resistance and we briefly discussed weight-  loss  strategies/dietary changes.   In terms of his chronic bloating symptoms, we will try eliminating  lactose from his diet.  He infrequently has heartburn, but starting him  on a PPI is another consideration.   If the patient's symptoms are persistent, we will consider GI  consultation.   He is to follow up with Dr. Clelia Croft regarding cancer followup care.  Follow up with me in approximately four weeks.     Barbette Hair. Artist Pais, DO  Electronically Signed    RDY/MedQ  DD: 11/16/2006  DT: 11/16/2006  Job #: [id number]

## 2011-04-14 ENCOUNTER — Ambulatory Visit (HOSPITAL_COMMUNITY)
Admission: RE | Admit: 2011-04-14 | Discharge: 2011-04-14 | Disposition: A | Discharge status: Home / Self Care | Payer: BC Managed Care – PPO | Source: Ambulatory Visit | Attending: Oncology | Admitting: Oncology

## 2011-04-14 ENCOUNTER — Encounter (HOSPITAL_BASED_OUTPATIENT_CLINIC_OR_DEPARTMENT_OTHER): Payer: BC Managed Care – PPO | Admitting: Oncology

## 2011-04-14 ENCOUNTER — Other Ambulatory Visit: Payer: Self-pay | Admitting: Oncology

## 2011-04-14 DIAGNOSIS — Z8547 Personal history of malignant neoplasm of testis: Secondary | ICD-10-CM

## 2011-04-14 DIAGNOSIS — C6290 Malignant neoplasm of unspecified testis, unspecified whether descended or undescended: Secondary | ICD-10-CM

## 2011-04-14 LAB — CBC WITH DIFFERENTIAL/PLATELET
BASO%: 0.5 % (ref 0.0–2.0)
HCT: 40.1 % (ref 38.4–49.9)
HGB: 13.5 g/dL (ref 13.0–17.1)
MCHC: 33.6 g/dL (ref 32.0–36.0)
MONO#: 0.4 10*3/uL (ref 0.1–0.9)
NEUT%: 70.1 % (ref 39.0–75.0)
RDW: 13.2 % (ref 11.0–14.6)
WBC: 5.3 10*3/uL (ref 4.0–10.3)
lymph#: 1.1 10*3/uL (ref 0.9–3.3)

## 2011-04-17 LAB — COMPREHENSIVE METABOLIC PANEL
Albumin: 4.3 g/dL (ref 3.5–5.2)
Alkaline Phosphatase: 69 U/L (ref 39–117)
CO2: 24 mEq/L (ref 19–32)
Calcium: 9.3 mg/dL (ref 8.4–10.5)
Chloride: 105 mEq/L (ref 96–112)
Glucose, Bld: 115 mg/dL — ABNORMAL HIGH (ref 70–99)
Potassium: 4.6 mEq/L (ref 3.5–5.3)
Sodium: 141 mEq/L (ref 135–145)
Total Protein: 7.6 g/dL (ref 6.0–8.3)

## 2011-04-17 LAB — LACTATE DEHYDROGENASE: LDH: 118 U/L (ref 94–250)

## 2011-04-17 LAB — AFP TUMOR MARKER: AFP-Tumor Marker: 2.6 ng/mL (ref 0.0–8.0)

## 2011-04-17 LAB — BETA HCG QUANT (REF LAB): Beta hCG, Tumor Marker: 1.5 m[IU]/mL (ref <5.0)

## 2011-10-11 ENCOUNTER — Encounter: Payer: Self-pay | Admitting: *Deleted

## 2011-10-13 ENCOUNTER — Ambulatory Visit: Payer: BC Managed Care – PPO | Admitting: Oncology

## 2011-10-13 ENCOUNTER — Other Ambulatory Visit: Payer: BC Managed Care – PPO | Admitting: Lab

## 2014-11-12 ENCOUNTER — Ambulatory Visit (AMBULATORY_SURGERY_CENTER): Payer: Self-pay | Admitting: *Deleted

## 2014-11-12 VITALS — Ht 70.0 in | Wt 300.0 lb

## 2014-11-12 DIAGNOSIS — Z1211 Encounter for screening for malignant neoplasm of colon: Secondary | ICD-10-CM

## 2014-11-12 NOTE — Progress Notes (Signed)
No egg or soy allergy. No anesthesia problems.  No home O2.  No diet meds.  

## 2014-11-26 ENCOUNTER — Encounter: Payer: BC Managed Care – PPO | Admitting: Internal Medicine

## 2014-11-26 ENCOUNTER — Encounter: Payer: Self-pay | Admitting: Internal Medicine

## 2014-12-01 ENCOUNTER — Other Ambulatory Visit: Payer: Self-pay | Admitting: Orthopaedic Surgery

## 2014-12-01 DIAGNOSIS — M25511 Pain in right shoulder: Secondary | ICD-10-CM

## 2014-12-15 ENCOUNTER — Ambulatory Visit
Admission: RE | Admit: 2014-12-15 | Discharge: 2014-12-15 | Disposition: A | Discharge status: Home / Self Care | Payer: BLUE CROSS/BLUE SHIELD | Source: Ambulatory Visit | Attending: Orthopaedic Surgery | Admitting: Orthopaedic Surgery

## 2014-12-15 DIAGNOSIS — M25511 Pain in right shoulder: Secondary | ICD-10-CM

## 2014-12-15 MED ORDER — IOHEXOL 180 MG/ML  SOLN
15.0000 mL | Freq: Once | INTRAMUSCULAR | Status: AC | PRN
  Administered 2014-12-15: 15 mL via INTRA_ARTICULAR

## 2014-12-17 ENCOUNTER — Other Ambulatory Visit: Payer: Self-pay | Admitting: Orthopedic Surgery

## 2014-12-17 DIAGNOSIS — M25511 Pain in right shoulder: Secondary | ICD-10-CM

## 2014-12-21 ENCOUNTER — Ambulatory Visit
Admission: RE | Admit: 2014-12-21 | Discharge: 2014-12-21 | Disposition: A | Discharge status: Home / Self Care | Payer: BLUE CROSS/BLUE SHIELD | Source: Ambulatory Visit | Attending: Orthopedic Surgery | Admitting: Orthopedic Surgery

## 2014-12-21 DIAGNOSIS — M25511 Pain in right shoulder: Secondary | ICD-10-CM

## 2014-12-24 ENCOUNTER — Other Ambulatory Visit (HOSPITAL_COMMUNITY): Payer: Self-pay | Admitting: Orthopedic Surgery

## 2014-12-24 ENCOUNTER — Ambulatory Visit
Admission: RE | Admit: 2014-12-24 | Discharge: 2014-12-24 | Disposition: A | Discharge status: Home / Self Care | Payer: BLUE CROSS/BLUE SHIELD | Source: Ambulatory Visit | Attending: Orthopedic Surgery | Admitting: Orthopedic Surgery

## 2014-12-24 ENCOUNTER — Other Ambulatory Visit: Payer: Self-pay | Admitting: Orthopedic Surgery

## 2014-12-24 DIAGNOSIS — M25511 Pain in right shoulder: Secondary | ICD-10-CM

## 2014-12-24 NOTE — H&P (Signed)
Nathaniel Bonilla is an 54 y.o. male.   Chief Complaint: Right shoulder instability HPI: Vikramis a 54 year old patient who is a month out from slip and fall and right shoulder dislocation. He's had 2 subsequent episodes of instability since that time. At times the shoulder itself reduced. Patient has had MRI scanning which shows partial thickness rotator cuff tearing along with significant anterior-inferior glenohumeral labrum avulsion and glenoid fracture. Patient presents now for operative management of post dislocation sequelae. The patient despite immobilization has had some episodes of instability. He denies any other orthopedic complaints. As recreational activity he enjoys weightlifting along with gardening.  Past Medical History  Diagnosis Date  . Allergy     seasonal  . Malignant neoplasm of other and unspecified testis     testicular  . Diabetes mellitus without complication   . Sleep apnea     cpap  . Thyroid disease     Past Surgical History  Procedure Laterality Date  . Testicle removal Left     Family History  Problem Relation Age of Onset  . Colon cancer Neg Hx    Social History:  reports that he has never smoked. He has never used smokeless tobacco. He reports that he does not drink alcohol or use illicit drugs.  Allergies:  Allergies  Allergen Reactions  . Guaifenesin     REACTION: irritability    No prescriptions prior to admission    No results found for this or any previous visit (from the past 48 hour(s)). Ct 3d Independent Wkst  12/24/2014   CLINICAL DATA:  Nonspecific (abnormal) findings on radiological and other examination of musculoskeletal system. Status post fall 11/25/2014 with a glenoid fracture.  EXAM: 3-DIMENSIONAL CT IMAGE RENDERING ON INDEPENDENT WORKSTATION  TECHNIQUE: 3-dimensional CT images were rendered by post-processing of the original CT data on an independent workstation. The 3-dimensional CT images were interpreted and findings were  reported in the accompanying complete CT report for this study  COMPARISON:  MR arthrogram right shoulder 12/15/2014.  FINDINGS: Surface reconstructed 3 dimensional images confirm fracture of the anterior, inferior glenoid as described on report of CT scan.  IMPRESSION: As above.   Electronically Signed   By: Inge Rise M.D.   On: 12/24/2014 14:08    Review of Systems  Constitutional: Negative.   HENT: Negative.   Eyes: Negative.   Respiratory: Negative.   Cardiovascular: Negative.   Gastrointestinal: Negative.   Genitourinary: Negative.   Musculoskeletal: Positive for joint pain.  Skin: Negative.   Neurological: Negative.   Endo/Heme/Allergies: Negative.   Psychiatric/Behavioral: Negative.     There were no vitals taken for this visit. Physical Exam  Constitutional: He appears well-developed.  HENT:  Head: Normocephalic.  Eyes: Pupils are equal, round, and reactive to light.  Neck: Normal range of motion.  Cardiovascular: Normal rate.   Respiratory: Effort normal.  Neurological: He is alert.  Skin: Skin is warm.  Psychiatric: He has a normal mood and affect.   examination of the right shoulder demonstrates functional deltoid external rotation 15 abduction is about 45-50 with some apprehension does have some coarse grinding with range of motion of the shoulder cuff strength is pretty reasonable to supraspinous interspinous testing  Assessment/Plan Impression is right shoulder instability with partial thickness rotator cuff tearing possible SLAP tear and anterior inferior glenoid rim fracture. MRI scanning and CT scanning shows that this does occupy a percentage of the glenoid face approaching 20%. Plan is arthroscopy evaluation the rotator cuff evaluation of the superior  labrum with possible repair is indicated final procedure will be potential fixation of glenoid fracture along with labral repair. Risk and benefits discussed with the patient including but limited to infection  or vessel damage potential for recurrent instability as well as shoulder stiffness and the potential need for more surgery which would be in his case an open surgery J type. Patient understands risk and benefits and wished to proceed with arthroscopic evaluation of the joint with repair of structures as needed. All questions answered.  DEAN,GREGORY SCOTT 12/24/2014, 6:50 PM

## 2014-12-25 ENCOUNTER — Encounter (HOSPITAL_COMMUNITY): Payer: Self-pay | Admitting: *Deleted

## 2014-12-25 NOTE — Progress Notes (Signed)
Several unsuccessful attempts were made to contact pt; lvm with new arrival time of 5:30 AM on Monday 12/28/14.

## 2014-12-25 NOTE — Progress Notes (Signed)
Pt denies SOB, chest pain, and being under the care of a cardiologist. Pt denies having a stress test, echo and cardiac cath. Pt denies having an EKG and chest x ray within the last year. . Pt made aware to stop taking Aspirin, vitamins, and herbal medications. Do not take any NSAIDs ie: Ibuprofen, Advil, Naproxen or any medication containing Aspirin. Pt made aware not to take any diabetic medication the morning of procedure. Pt verbalized understanding.

## 2014-12-27 MED ORDER — CEFAZOLIN SODIUM 10 G IJ SOLR
3.0000 g | INTRAMUSCULAR | Status: DC
  Filled 2014-12-27: qty 3000

## 2014-12-28 ENCOUNTER — Encounter (HOSPITAL_COMMUNITY): Payer: Self-pay | Admitting: Certified Registered Nurse Anesthetist

## 2014-12-28 ENCOUNTER — Ambulatory Visit (HOSPITAL_COMMUNITY): Payer: BLUE CROSS/BLUE SHIELD | Admitting: Certified Registered Nurse Anesthetist

## 2014-12-28 ENCOUNTER — Ambulatory Visit (HOSPITAL_COMMUNITY)
Admission: RE | Admit: 2014-12-28 | Discharge: 2014-12-28 | Disposition: A | Discharge status: Home / Self Care | Payer: BLUE CROSS/BLUE SHIELD | Source: Ambulatory Visit | Attending: Orthopedic Surgery | Admitting: Orthopedic Surgery

## 2014-12-28 ENCOUNTER — Encounter (HOSPITAL_COMMUNITY)
Admission: RE | Disposition: A | Discharge status: Home / Self Care | Payer: Self-pay | Source: Ambulatory Visit | Attending: Orthopedic Surgery

## 2014-12-28 DIAGNOSIS — Z8547 Personal history of malignant neoplasm of testis: Secondary | ICD-10-CM | POA: Diagnosis not present

## 2014-12-28 DIAGNOSIS — M75111 Incomplete rotator cuff tear or rupture of right shoulder, not specified as traumatic: Secondary | ICD-10-CM | POA: Insufficient documentation

## 2014-12-28 DIAGNOSIS — E119 Type 2 diabetes mellitus without complications: Secondary | ICD-10-CM | POA: Diagnosis not present

## 2014-12-28 DIAGNOSIS — M25311 Other instability, right shoulder: Secondary | ICD-10-CM | POA: Insufficient documentation

## 2014-12-28 DIAGNOSIS — G4730 Sleep apnea, unspecified: Secondary | ICD-10-CM | POA: Insufficient documentation

## 2014-12-28 DIAGNOSIS — E079 Disorder of thyroid, unspecified: Secondary | ICD-10-CM | POA: Insufficient documentation

## 2014-12-28 HISTORY — PX: SHOULDER ARTHROSCOPY WITH LABRAL REPAIR: SHX5691

## 2014-12-28 LAB — CBC
HEMATOCRIT: 43.3 % (ref 39.0–52.0)
Hemoglobin: 14.7 g/dL (ref 13.0–17.0)
MCH: 29.9 pg (ref 26.0–34.0)
MCHC: 33.9 g/dL (ref 30.0–36.0)
MCV: 88.2 fL (ref 78.0–100.0)
PLATELETS: 223 10*3/uL (ref 150–400)
RBC: 4.91 MIL/uL (ref 4.22–5.81)
RDW: 12.7 % (ref 11.5–15.5)
WBC: 5.7 10*3/uL (ref 4.0–10.5)

## 2014-12-28 LAB — BASIC METABOLIC PANEL
Anion gap: 8 (ref 5–15)
BUN: 20 mg/dL (ref 6–23)
CHLORIDE: 104 mmol/L (ref 96–112)
CO2: 26 mmol/L (ref 19–32)
CREATININE: 1.18 mg/dL (ref 0.50–1.35)
Calcium: 9.2 mg/dL (ref 8.4–10.5)
GFR calc Af Amer: 80 mL/min — ABNORMAL LOW (ref >90)
GFR calc non Af Amer: 69 mL/min — ABNORMAL LOW (ref >90)
GLUCOSE: 133 mg/dL — AB (ref 70–99)
POTASSIUM: 4.1 mmol/L (ref 3.5–5.1)
Sodium: 138 mmol/L (ref 135–145)

## 2014-12-28 LAB — GLUCOSE, CAPILLARY
GLUCOSE-CAPILLARY: 122 mg/dL — AB (ref 70–99)
Glucose-Capillary: 137 mg/dL — ABNORMAL HIGH (ref 70–99)

## 2014-12-28 SURGERY — ARTHROSCOPY, SHOULDER, WITH GLENOID LABRUM REPAIR
Anesthesia: General | Site: Shoulder | Laterality: Right

## 2014-12-28 MED ORDER — MIDAZOLAM HCL 2 MG/2ML IJ SOLN
INTRAMUSCULAR | Status: AC
  Filled 2014-12-28: qty 2

## 2014-12-28 MED ORDER — BUPIVACAINE HCL (PF) 0.25 % IJ SOLN
INTRAMUSCULAR | Status: AC
  Filled 2014-12-28: qty 30

## 2014-12-28 MED ORDER — LIDOCAINE HCL (CARDIAC) 20 MG/ML IV SOLN
INTRAVENOUS | Status: DC | PRN
  Administered 2014-12-28: 100 mg via INTRAVENOUS

## 2014-12-28 MED ORDER — PROPOFOL 10 MG/ML IV BOLUS
INTRAVENOUS | Status: AC
  Filled 2014-12-28: qty 20

## 2014-12-28 MED ORDER — SODIUM CHLORIDE 0.9 % IJ SOLN
INTRAMUSCULAR | Status: DC | PRN
  Administered 2014-12-28: 50 mL via INTRAVENOUS

## 2014-12-28 MED ORDER — EPHEDRINE SULFATE 50 MG/ML IJ SOLN
INTRAMUSCULAR | Status: AC
  Filled 2014-12-28: qty 1

## 2014-12-28 MED ORDER — PHENYLEPHRINE HCL 10 MG/ML IJ SOLN
INTRAMUSCULAR | Status: DC | PRN
  Administered 2014-12-28 (×2): 80 ug via INTRAVENOUS
  Administered 2014-12-28: 120 ug via INTRAVENOUS
  Administered 2014-12-28 (×2): 80 ug via INTRAVENOUS

## 2014-12-28 MED ORDER — SODIUM CHLORIDE 0.9 % IJ SOLN
INTRAMUSCULAR | Status: AC
  Filled 2014-12-28: qty 10

## 2014-12-28 MED ORDER — ROCURONIUM BROMIDE 100 MG/10ML IV SOLN
INTRAVENOUS | Status: DC | PRN
  Administered 2014-12-28: 30 mg via INTRAVENOUS
  Administered 2014-12-28: 20 mg via INTRAVENOUS

## 2014-12-28 MED ORDER — CEFAZOLIN SODIUM-DEXTROSE 2-3 GM-% IV SOLR
INTRAVENOUS | Status: AC
  Filled 2014-12-28: qty 50

## 2014-12-28 MED ORDER — FENTANYL CITRATE 0.05 MG/ML IJ SOLN
INTRAMUSCULAR | Status: DC | PRN
  Administered 2014-12-28 (×4): 50 ug via INTRAVENOUS

## 2014-12-28 MED ORDER — ONDANSETRON HCL 4 MG/2ML IJ SOLN
INTRAMUSCULAR | Status: DC | PRN
  Administered 2014-12-28: 4 mg via INTRAVENOUS

## 2014-12-28 MED ORDER — OXYCODONE HCL 5 MG/5ML PO SOLN: 5.0000 mg | Freq: Once | ORAL | Status: AC | PRN

## 2014-12-28 MED ORDER — CHLORHEXIDINE GLUCONATE 4 % EX LIQD
60.0000 mL | Freq: Once | CUTANEOUS | Status: DC
  Filled 2014-12-28: qty 60

## 2014-12-28 MED ORDER — PHENYLEPHRINE HCL 10 MG/ML IJ SOLN
10.0000 mg | INTRAVENOUS | Status: DC | PRN
  Administered 2014-12-28: 50 ug/min via INTRAVENOUS

## 2014-12-28 MED ORDER — EPINEPHRINE HCL 1 MG/ML IJ SOLN: INTRAMUSCULAR | Status: DC | PRN

## 2014-12-28 MED ORDER — PROMETHAZINE HCL 25 MG/ML IJ SOLN
6.2500 mg | INTRAMUSCULAR | Status: DC | PRN
  Administered 2014-12-28: 6.25 mg via INTRAVENOUS

## 2014-12-28 MED ORDER — BUPIVACAINE-EPINEPHRINE (PF) 0.5% -1:200000 IJ SOLN
INTRAMUSCULAR | Status: DC | PRN
  Administered 2014-12-28: 30 mL via PERINEURAL

## 2014-12-28 MED ORDER — SUCCINYLCHOLINE CHLORIDE 20 MG/ML IJ SOLN
INTRAMUSCULAR | Status: DC | PRN
  Administered 2014-12-28: 120 mg via INTRAVENOUS

## 2014-12-28 MED ORDER — EPINEPHRINE HCL 1 MG/ML IJ SOLN
INTRAMUSCULAR | Status: AC
  Filled 2014-12-28: qty 1

## 2014-12-28 MED ORDER — PROMETHAZINE HCL 25 MG/ML IJ SOLN
INTRAMUSCULAR | Status: AC
  Administered 2014-12-28: 6.25 mg via INTRAVENOUS
  Filled 2014-12-28: qty 1

## 2014-12-28 MED ORDER — PROPOFOL 10 MG/ML IV BOLUS
INTRAVENOUS | Status: DC | PRN
  Administered 2014-12-28: 30 mg via INTRAVENOUS
  Administered 2014-12-28: 200 mg via INTRAVENOUS

## 2014-12-28 MED ORDER — HYDROMORPHONE HCL 1 MG/ML IJ SOLN
INTRAMUSCULAR | Status: AC
  Filled 2014-12-28: qty 1

## 2014-12-28 MED ORDER — MIDAZOLAM HCL 5 MG/5ML IJ SOLN
INTRAMUSCULAR | Status: DC | PRN
  Administered 2014-12-28 (×2): 1 mg via INTRAVENOUS

## 2014-12-28 MED ORDER — DEXTROSE 5 % IV SOLN
3.0000 g | INTRAVENOUS | Status: DC | PRN
  Administered 2014-12-28: 3 g via INTRAVENOUS
  Administered 2014-12-28: 2 g via INTRAVENOUS

## 2014-12-28 MED ORDER — HYDROMORPHONE HCL 1 MG/ML IJ SOLN
0.2500 mg | INTRAMUSCULAR | Status: DC | PRN
  Administered 2014-12-28: 0.5 mg via INTRAVENOUS

## 2014-12-28 MED ORDER — BUPIVACAINE HCL (PF) 0.25 % IJ SOLN: INTRAMUSCULAR | Status: DC | PRN

## 2014-12-28 MED ORDER — SODIUM CHLORIDE 0.9 % IR SOLN
Status: DC | PRN
  Administered 2014-12-28 (×11): 6000 mL

## 2014-12-28 MED ORDER — OXYCODONE HCL 5 MG PO TABS
5.0000 mg | ORAL_TABLET | Freq: Once | ORAL | Status: AC | PRN
  Administered 2014-12-28: 5 mg via ORAL

## 2014-12-28 MED ORDER — FENTANYL CITRATE 0.05 MG/ML IJ SOLN
INTRAMUSCULAR | Status: AC
  Filled 2014-12-28: qty 5

## 2014-12-28 MED ORDER — OXYCODONE HCL 5 MG PO TABS
ORAL_TABLET | ORAL | Status: AC
  Filled 2014-12-28: qty 1

## 2014-12-28 MED ORDER — LACTATED RINGERS IV SOLN
INTRAVENOUS | Status: DC | PRN
  Administered 2014-12-28 (×2): via INTRAVENOUS

## 2014-12-28 MED ORDER — LIDOCAINE HCL (CARDIAC) 20 MG/ML IV SOLN
INTRAVENOUS | Status: AC
  Filled 2014-12-28: qty 5

## 2014-12-28 SURGICAL SUPPLY — 84 items
ANCH SUT SHRT 12.5 CANN EYLT (Anchor) ×5 IMPLANT
ANCHOR SUT BIOCOMP LK 2.9X12.5 (Anchor) ×10 IMPLANT
APL SKNCLS STERI-STRIP NONHPOA (GAUZE/BANDAGES/DRESSINGS) ×1
BENZOIN TINCTURE PRP APPL 2/3 (GAUZE/BANDAGES/DRESSINGS) ×3 IMPLANT
BIT DRILL TAK (DRILL) IMPLANT
BLADE CUDA 5.5 (BLADE) IMPLANT
BLADE CUTTER GATOR 3.5 (BLADE) ×5 IMPLANT
BLADE GREAT WHITE 4.2 (BLADE) ×2 IMPLANT
BLADE GREAT WHITE 4.2MM (BLADE) ×1
BLADE SURG 11 STRL SS (BLADE) ×3 IMPLANT
BUR GATOR 2.9 (BURR) IMPLANT
BUR GATOR 2.9MM (BURR)
BUR OVAL 6.0 (BURR) ×3 IMPLANT
CANNULA 5.75X71 LONG (CANNULA) ×8 IMPLANT
CANNULA SHOULDER 7CM (CANNULA) IMPLANT
CANNULA TWIST IN 6X9CM RED TRO (CANNULA) ×2 IMPLANT
CLOSURE WOUND 1/2 X4 (GAUZE/BANDAGES/DRESSINGS) ×1
COVER SURGICAL LIGHT HANDLE (MISCELLANEOUS) ×3 IMPLANT
DRAPE INCISE IOBAN 66X45 STRL (DRAPES) ×6 IMPLANT
DRAPE STERI 35X30 U-POUCH (DRAPES) ×3 IMPLANT
DRAPE U-SHAPE 47X51 STRL (DRAPES) ×6 IMPLANT
DRILL TAK (DRILL)
DRSG MEPILEX BORDER 4X4 (GAUZE/BANDAGES/DRESSINGS) ×2 IMPLANT
DRSG PAD ABDOMINAL 8X10 ST (GAUZE/BANDAGES/DRESSINGS) ×9 IMPLANT
DURAPREP 26ML APPLICATOR (WOUND CARE) ×3 IMPLANT
ELECT MENISCUS 165MM 90D (ELECTRODE) IMPLANT
ELECT REM PT RETURN 9FT ADLT (ELECTROSURGICAL) ×3
ELECTRODE REM PT RTRN 9FT ADLT (ELECTROSURGICAL) ×1 IMPLANT
FIBERSTICK 2 (SUTURE) ×2 IMPLANT
FILTER STRAW FLUID ASPIR (MISCELLANEOUS) ×3 IMPLANT
GAUZE SPONGE 4X4 12PLY STRL (GAUZE/BANDAGES/DRESSINGS) ×3 IMPLANT
GAUZE XEROFORM 1X8 LF (GAUZE/BANDAGES/DRESSINGS) ×3 IMPLANT
GLOVE BIO SURGEON ST LM GN SZ9 (GLOVE) ×3 IMPLANT
GLOVE BIOGEL PI IND STRL 8 (GLOVE) ×1 IMPLANT
GLOVE BIOGEL PI INDICATOR 8 (GLOVE) ×2
GLOVE SURG ORTHO 8.0 STRL STRW (GLOVE) ×3 IMPLANT
GOWN STRL REUS W/ TWL LRG LVL3 (GOWN DISPOSABLE) ×3 IMPLANT
GOWN STRL REUS W/TWL LRG LVL3 (GOWN DISPOSABLE) ×9
KIT BASIN OR (CUSTOM PROCEDURE TRAY) ×3 IMPLANT
KIT DISPOSABLE PUSHLOCK 2.9MM (KITS) IMPLANT
KIT PUSHLOCK 2.9 HIP (KITS) ×2 IMPLANT
KIT ROOM TURNOVER OR (KITS) ×3 IMPLANT
LASSO 90 CVE QUICKPAS (DISPOSABLE) ×8 IMPLANT
LASSO CRESCENT QUICKPASS (SUTURE) IMPLANT
LASSO SUT 90 DEGREE (SUTURE) IMPLANT
MANIFOLD NEPTUNE II (INSTRUMENTS) ×3 IMPLANT
NDL HYPO 25X1 1.5 SAFETY (NEEDLE) ×1 IMPLANT
NDL SCORPION MULTI FIRE (NEEDLE) IMPLANT
NDL SPNL 18GX3.5 QUINCKE PK (NEEDLE) ×1 IMPLANT
NDL SUT 6 .5 CRC .975X.05 MAYO (NEEDLE) ×1 IMPLANT
NEEDLE HYPO 25X1 1.5 SAFETY (NEEDLE) ×3 IMPLANT
NEEDLE MAYO TAPER (NEEDLE) ×3
NEEDLE SCORPION MULTI FIRE (NEEDLE) ×3 IMPLANT
NEEDLE SPNL 18GX3.5 QUINCKE PK (NEEDLE) ×3 IMPLANT
NS IRRIG 1000ML POUR BTL (IV SOLUTION) ×3 IMPLANT
PACK SHOULDER (CUSTOM PROCEDURE TRAY) ×3 IMPLANT
PAD ARMBOARD 7.5X6 YLW CONV (MISCELLANEOUS) ×6 IMPLANT
SET ARTHROSCOPY TUBING (MISCELLANEOUS) ×9
SET ARTHROSCOPY TUBING LN (MISCELLANEOUS) ×1 IMPLANT
SLING ARM IMMOBILIZER MED (SOFTGOODS) IMPLANT
SPEAR FASTAKII (SLEEVE) IMPLANT
SPONGE LAP 4X18 X RAY DECT (DISPOSABLE) ×6 IMPLANT
STRIP CLOSURE SKIN 1/2X4 (GAUZE/BANDAGES/DRESSINGS) ×2 IMPLANT
SUCTION FRAZIER TIP 10 FR DISP (SUCTIONS) ×3 IMPLANT
SUT ETHILON 3 0 PS 1 (SUTURE) ×3 IMPLANT
SUT FIBERWIRE 2-0 18 17.9 3/8 (SUTURE) ×9
SUT LASSO 45 DEGREE LEFT (SUTURE) IMPLANT
SUT LASSO 45D RIGHT (SUTURE) IMPLANT
SUT PROLENE 3 0 PS 2 (SUTURE) ×3 IMPLANT
SUT VIC AB 0 CT1 27 (SUTURE) ×6
SUT VIC AB 0 CT1 27XBRD ANBCTR (SUTURE) ×2 IMPLANT
SUT VIC AB 1 CT1 27 (SUTURE) ×3
SUT VIC AB 1 CT1 27XBRD ANBCTR (SUTURE) ×1 IMPLANT
SUT VIC AB 2-0 CT1 27 (SUTURE) ×3
SUT VIC AB 2-0 CT1 TAPERPNT 27 (SUTURE) ×1 IMPLANT
SUT VICRYL 0 UR6 27IN ABS (SUTURE) IMPLANT
SUTURE FIBERWR 2-0 18 17.9 3/8 (SUTURE) IMPLANT
SYR 20CC LL (SYRINGE) ×6 IMPLANT
SYR 3ML LL SCALE MARK (SYRINGE) ×3 IMPLANT
SYR TB 1ML LUER SLIP (SYRINGE) ×3 IMPLANT
TOWEL OR 17X24 6PK STRL BLUE (TOWEL DISPOSABLE) ×3 IMPLANT
TOWEL OR 17X26 10 PK STRL BLUE (TOWEL DISPOSABLE) ×3 IMPLANT
WAND HAND CNTRL MULTIVAC 90 (MISCELLANEOUS) IMPLANT
WATER STERILE IRR 1000ML POUR (IV SOLUTION) ×3 IMPLANT

## 2014-12-28 NOTE — Transfer of Care (Signed)
Immediate Anesthesia Transfer of Care Note  Patient: Nathaniel Bonilla  Procedure(s) Performed: Procedure(s) with comments: SHOULDER ARTHROSCOPY WITH LABRAL REPAIR, GELENOID FIXATION (Right) - RIGHT SHOULDER ARTHROSCOPY, GLENOID FIXATION, LABRAL REPAIR.  Patient Location: PACU  Anesthesia Type:GA combined with regional for post-op pain  Level of Consciousness: awake, alert  and oriented  Airway & Oxygen Therapy: Patient Spontanous Breathing and Patient connected to nasal cannula oxygen  Post-op Assessment: Report given to RN, Post -op Vital signs reviewed and stable and Patient moving all extremities X 4  Post vital signs: Reviewed and stable  Last Vitals:  Filed Vitals:   12/28/14 0626  BP: 113/85  Pulse: 78  Temp: 71.6 C    Complications: No apparent anesthesia complications

## 2014-12-28 NOTE — Brief Op Note (Signed)
12/28/2014  12:23 PM  PATIENT:  Nathaniel Bonilla  54 y.o. male  PRE-OPERATIVE DIAGNOSIS:  RIGHT SHOULDER INSTABILITY AND GLENOID FRACTURE  POST-OPERATIVE DIAGNOSIS:  RIGHT SHOULDER INSTABILITY AND GLENOID FRACTURE  PROCEDURE:  Procedure(s): SHOULDER ARTHROSCOPY WITH LABRAL REPAIR, GELENOID FIXATION  SURGEON:  Surgeon(s): Meredith Pel, MD  ASSISTANT: April green rnfa  ANESTHESIA:   general  EBL: 15 ml    Total I/O In: -  Out: 250 [Urine:250]  BLOOD ADMINISTERED: none  DRAINS: none   LOCAL MEDICATIONS USED:  none  SPECIMEN:  No Specimen  COUNTS:  YES  TOURNIQUET:  * No tourniquets in log *  DICTATION: .Other Dictation: Dictation Number [id number]  PLAN OF CARE: Discharge to home after PACU  PATIENT DISPOSITION:  PACU - hemodynamically stable

## 2014-12-28 NOTE — Anesthesia Preprocedure Evaluation (Addendum)
Anesthesia Evaluation  Patient identified by MRN, date of birth, ID band Patient awake    Reviewed: Allergy & Precautions, NPO status   Airway        Dental   Pulmonary sleep apnea ,          Cardiovascular     Neuro/Psych    GI/Hepatic   Endo/Other  diabetesMorbid obesity  Renal/GU      Musculoskeletal   Abdominal   Peds  Hematology   Anesthesia Other Findings   Reproductive/Obstetrics                            Anesthesia Physical Anesthesia Plan  ASA: III  Anesthesia Plan: General   Post-op Pain Management:    Induction: Intravenous  Airway Management Planned: Oral ETT  Additional Equipment:   Intra-op Plan:   Post-operative Plan: Extubation in OR  Informed Consent: I have reviewed the patients History and Physical, chart, labs and discussed the procedure including the risks, benefits and alternatives for the proposed anesthesia with the patient or authorized representative who has indicated his/her understanding and acceptance.   Dental advisory given  Plan Discussed with: CRNA and Surgeon  Anesthesia Plan Comments:         Anesthesia Quick Evaluation

## 2014-12-28 NOTE — Interval H&P Note (Signed)
History and Physical Interval Note:  12/28/2014 6:59 AM  Nathaniel Bonilla  has presented today for surgery, with the diagnosis of RIGHT SHOULDER INSTABILITY AND GLENOID FRACTURE  The various methods of treatment have been discussed with the patient and family. After consideration of risks, benefits and other options for treatment, the patient has consented to  Procedure(s) with comments: SHOULDER ARTHROSCOPY WITH LABRAL REPAIR, GELENOID FIXATION (Right) - RIGHT SHOULDER ARTHROSCOPY, GLENOID FIXATION, LABRAL REPAIR. as a surgical intervention .  The patient's history has been reviewed, patient examined, no change in status, stable for surgery.  I have reviewed the patient's chart and labs.  Questions were answered to the patient's satisfaction.     DEAN,GREGORY SCOTT

## 2014-12-28 NOTE — Anesthesia Postprocedure Evaluation (Signed)
  Anesthesia Post-op Note  Patient: Nathaniel Bonilla  Procedure(s) Performed: Procedure(s) with comments: SHOULDER ARTHROSCOPY WITH LABRAL REPAIR, GELENOID FIXATION (Right) - RIGHT SHOULDER ARTHROSCOPY, GLENOID FIXATION, LABRAL REPAIR.  Patient Location: PACU  Anesthesia Type:General  Level of Consciousness: awake and alert   Airway and Oxygen Therapy: Patient Spontanous Breathing  Post-op Pain: none  Post-op Assessment: Post-op Vital signs reviewed  Post-op Vital Signs: stable  Last Vitals:  Filed Vitals:   12/28/14 1345  BP: 96/58  Pulse: 79  Temp:   Resp: 16    Complications: No apparent anesthesia complications

## 2014-12-28 NOTE — Discharge Instructions (Signed)
°What to eat: ° °For your first meals, you should eat lightly; only small meals initially.  If you do not have nausea, you may eat larger meals.  Avoid spicy, greasy and heavy food.   ° °General Anesthesia, Adult, Care After  °Refer to this sheet in the next few weeks. These instructions provide you with information on caring for yourself after your procedure. Your health care provider may also give you more specific instructions. Your treatment has been planned according to current medical practices, but problems sometimes occur. Call your health care provider if you have any problems or questions after your procedure.  °WHAT TO EXPECT AFTER THE PROCEDURE  °After the procedure, it is typical to experience:  °Sleepiness.  °Nausea and vomiting. °HOME CARE INSTRUCTIONS  °For the first 24 hours after general anesthesia:  °Have a responsible person with you.  °Do not drive a car. If you are alone, do not take public transportation.  °Do not drink alcohol.  °Do not take medicine that has not been prescribed by your health care provider.  °Do not sign important papers or make important decisions.  °You may resume a normal diet and activities as directed by your health care provider.  °Change bandages (dressings) as directed.  °If you have questions or problems that seem related to general anesthesia, call the hospital and ask for the anesthetist or anesthesiologist on call. °SEEK MEDICAL CARE IF:  °You have nausea and vomiting that continue the day after anesthesia.  °You develop a rash. °SEEK IMMEDIATE MEDICAL CARE IF:  °You have difficulty breathing.  °You have chest pain.  °You have any allergic problems. °Document Released: 01/22/2001 Document Revised: 06/18/2013 Document Reviewed: 05/01/2013  °ExitCare® Patient Information ©2014 [employer].  ° °Sore Throat  ° ° °A sore throat is a painful, burning, sore, or scratchy feeling of the throat. There may be pain or tenderness when swallowing or talking. You may have  other symptoms with a sore throat. These include coughing, sneezing, fever, or a swollen neck. A sore throat is often the first sign of another sickness. These sicknesses may include a cold, flu, strep throat, or an infection called mono. Most sore throats go away without medical treatment.  °HOME CARE  °Only take medicine as told by your doctor.  °Drink enough fluids to keep your pee (urine) clear or pale yellow.  °Rest as needed.  °Try using throat sprays, lozenges, or suck on hard candy (if older than 4 years or as told).  °Sip warm liquids, such as broth, herbal tea, or warm water with honey. Try sucking on frozen ice pops or drinking cold liquids.  °Rinse the mouth (gargle) with salt water. Mix 1 teaspoon salt with 8 ounces of water.  °Do not smoke. Avoid being around others when they are smoking.  °Put a humidifier in your bedroom at night to moisten the air. You can also turn on a hot shower and sit in the bathroom for 5-10 minutes. Be sure the bathroom door is closed. °GET HELP RIGHT AWAY IF:  °You have trouble breathing.  °You cannot swallow fluids, soft foods, or your spit (saliva).  °You have more puffiness (swelling) in the throat.  °Your sore throat does not get better in 7 days.  °You feel sick to your stomach (nauseous) and throw up (vomit).  °You have a fever or lasting symptoms for more than 2-3 days.  °You have a fever and your symptoms suddenly get worse. °MAKE SURE YOU:  °Understand these   instructions.  °Will watch your condition.  °Will get help right away if you are not doing well or get worse. °Document Released: 07/25/2008 Document Revised: 07/10/2012 Document Reviewed: 06/23/2012  °ExitCare® Patient Information ©2015 [employer]. This information is not intended to replace advice given to you by your health care provider. Make sure you discuss any questions you have with your health care provider.  ° ° ° °

## 2014-12-28 NOTE — Anesthesia Procedure Notes (Addendum)
Anesthesia Regional Block:  Interscalene brachial plexus block  Pre-Anesthetic Checklist: ,, timeout performed, Correct Patient, Correct Site, Correct Laterality, Correct Procedure, Correct Position, site marked, Risks and benefits discussed, at surgeon's request and post-op pain management  Laterality: Upper  Prep: chloraprep and alcohol swabs       Needles:  Injection technique: Single-shot  Needle Type: Stimulator Needle - 40     Needle Length: 9cm 9 cm Needle Gauge: 21 and 21 G  Needle insertion depth: 4 cm   Additional Needles:  Procedures: ultrasound guided (picture in chart) and nerve stimulator Interscalene brachial plexus block  Nerve Stimulator or Paresthesia:  Response: Twitch elicited, 0.5 mA, 0.3 ms,   Additional Responses:   Narrative:  Start time: 12/28/2014 7:10 AM End time: 12/28/2014 7:20 AM Injection made incrementally with aspirations every 5 mL.  Performed by: Personally  Anesthesiologist: MASSAGEE, TERRY  Additional Notes: Block assessed prior to start of surgery   Procedure Name: Intubation Date/Time: 12/28/2014 7:42 AM Performed by: Trixie Deis A Pre-anesthesia Checklist: Patient identified, Emergency Drugs available, Suction available and Patient being monitored Patient Re-evaluated:Patient Re-evaluated prior to inductionOxygen Delivery Method: Circle system utilized Preoxygenation: Pre-oxygenation with 100% oxygen Intubation Type: IV induction Ventilation: Oral airway inserted - appropriate to patient size and Mask ventilation without difficulty Laryngoscope Size: Miller and 2 Grade View: Grade II Tube type: Oral Tube size: 7.5 mm Number of attempts: 2 Airway Equipment and Method: Stylet Placement Confirmation: ETT inserted through vocal cords under direct vision,  positive ETCO2 and breath sounds checked- equal and bilateral Secured at: 25 cm Tube secured with: Tape Dental Injury: Teeth and Oropharynx as per pre-operative assessment   Comments: Patient positioned with shoulder roll and slight reverse tburg positioning. CBertrum Sol SRNA DL x 1 with Sabra Heck 2 - grade view 2 and unsuccessful atraumatic attempt to place ETT. MDA DL x 1 with Sabra Heck 2 - successful ETT placement.

## 2014-12-29 ENCOUNTER — Encounter (HOSPITAL_COMMUNITY): Payer: Self-pay | Admitting: Orthopedic Surgery

## 2014-12-29 NOTE — Op Note (Signed)
NAMEARDITH, LEWMAN NO.:  [id number]  MEDICAL RECORD NO.:  [id number]  LOCATION:  [facility]                         FACILITY:  [facility]  PHYSICIAN:  Anderson Malta, M.D.    DATE OF BIRTH:  August 12, 1961  DATE OF PROCEDURE: DATE OF DISCHARGE:  12/28/2014                              OPERATIVE REPORT   PREOP DIAGNOSIS:  Right shoulder instability.  POSTOP DIAGNOSIS:  Right shoulder instability.  PROCEDURE:  Right shoulder arthroscopy, bony Bankart/labral repair.  SURGEONS:  Anderson Malta, M.D.  ASSIST:  None.  ANESTHESIA:  General.  INDICATIONS:  Nathaniel Bonilla is a patient with right shoulder pain and instability presents for operative management after explanation of risks and benefits.  OPERATIVE FINDINGS: 1. Examination under anesthesia, range of motion.  He has about 50     degrees of external rotation and 15 degrees abduction, forward     flexion is about 170, explosive animal abduction is about 95,     shoulder stability is a 2+ anterior unstable, 1+ posterior with     less than a centimeter sulcus sign. 2. Diagnostic shoulder arthroscopy.     a.         I. Intact labral anchor.         II.Partial-thickness tearing of the rotator cuff,             supraspinatus.         III.     Tear of the anterior-inferior labrum attached to the             bony Bankart fragment piece which went from about the 2:30             position down to the 6 o'clock position.  PROCEDURE IN DETAIL:  Patient was brought to operating room, where general endotracheal anesthesia was induced.  Preoperative antibiotics were administered.  Time-out was called.  The patient was placed in lateral decubitus position with the left axilla, left peroneal nerve well-padded.  Arm was suspended in traction in approximately 10 degrees of forward flexion and 45 degrees of abduction.  The whole hand, arm, and axilla was prescrubbed with alcohol and Betadine and allowed to air dry, prepped with DuraPrep  solution, draped in usual sterile manner. Charlie Pitter was used to cover the axilla.  15 pounds of distal traction was applied.  Lateral traction was placed through the arm holder.  At this time, posterior portal was created about 2 cm inferior to the posterolateral margin of the acromion.  Diagnostic arthroscopy was performed.  Rotator cuff partial thickness tear.  Biceps anchor was stable.  Glenohumeral articular surfaces had some wear as particularly anterior inferior where the dislocation had occurred.  Hill-Sachs lesion was present.  At this time, the anterior supra-bicipital portal was created in addition to an anterior portal right at the intersection of the subscap attachment at the superior border.  Portal reversal was performed.  The fracture fragment was mobilized, and it actually mobilized well.  It thinned out more medially.  The difficult part of this case was actually getting sutures around the bony fragment.  Two sutures were placed around the bony fragment.  Care was taken  to avoid injury to the axillary nerve.  At this time, following good suture fixation at the 5 o'clock and 4 o'clock position, about the 5 o'clock and 3:30 position these sutures were placed into the native glenoid face with an Arthrex push-lock.  This gave a secure fixation.  The fracture was mobilized slightly more on the clock face counter clockwise.  This allowed for re-tensioning of the ligament.  The 3rd anchor was placed at the superior aspect of the fracture through soft tissue and was anchored into the about the 2 o'clock position.  Three anchors were utilized. Good stability was achieved.  At this time, a thorough irrigation of the joint was performed, instruments were removed from the portals, which were then closed using 2-0 Vicryl, 3-0 nylon and Mepilex dressing placed.  Patient tolerated the procedure well without immediate complication, transferred to recovery room in stable condition.     Anderson Malta, M.D.     GSD/MEDQ  D:  12/28/2014  T:  12/29/2014  Job:  [id number]

## 2014-12-31 ENCOUNTER — Ambulatory Visit (HOSPITAL_COMMUNITY)
Admission: RE | Admit: 2014-12-31 | Discharge: 2014-12-31 | Disposition: A | Discharge status: Home / Self Care | Payer: BLUE CROSS/BLUE SHIELD | Source: Ambulatory Visit | Attending: Orthopedic Surgery | Admitting: Orthopedic Surgery

## 2014-12-31 ENCOUNTER — Other Ambulatory Visit (HOSPITAL_COMMUNITY): Payer: Self-pay | Admitting: Orthopedic Surgery

## 2014-12-31 DIAGNOSIS — M25569 Pain in unspecified knee: Secondary | ICD-10-CM

## 2014-12-31 DIAGNOSIS — M7989 Other specified soft tissue disorders: Secondary | ICD-10-CM | POA: Insufficient documentation

## 2014-12-31 DIAGNOSIS — M79606 Pain in leg, unspecified: Secondary | ICD-10-CM | POA: Diagnosis present

## 2014-12-31 NOTE — Progress Notes (Signed)
*  Preliminary Results* Bilateral lower extremity venous duplex completed. Bilateral lower extremities are negative for deep vein thrombosis. There is no evidence of Baker's cyst bilaterally.  12/31/2014  Maudry Mayhew, RVT, RDCS, RDMS

## 2015-01-14 ENCOUNTER — Encounter: Payer: BLUE CROSS/BLUE SHIELD | Admitting: Internal Medicine

## 2016-05-23 IMAGING — MR MR SHOULDER*R* W/CM
6 series · 40 of 40 positions shown · IV contrast (agent unspecified)
Comparison: MRI dated 01/22/2008

CLINICAL DATA: Right shoulder pain since a fall on ice 2 weeks ago
with resultant shoulder dislocation.

EXAM:
MR ARTHROGRAM OF THE RIGHT SHOULDER
TECHNIQUE: Multiplanar, multisequence MR imaging of the right shoulder was
performed following the administration of intra-articular contrast.
CONTRAST:  See Injection Documentation.

[Series 6: T1 fat-sat · axial · 4.0mm · 0.23mm/px · z∈[-6,+65]mm · 8 of 18 slices shown (1 of 3)]
[im 1/18]
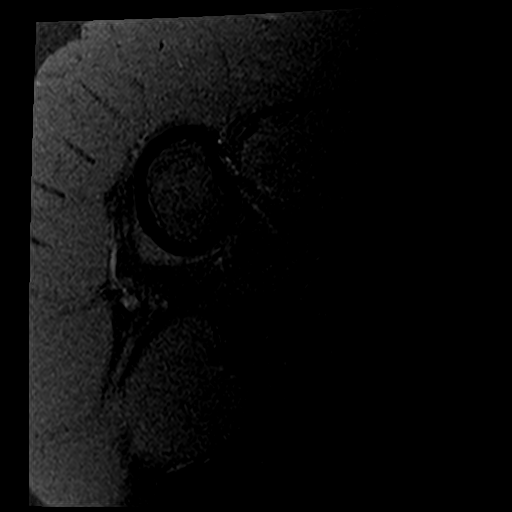
[im 3/18]
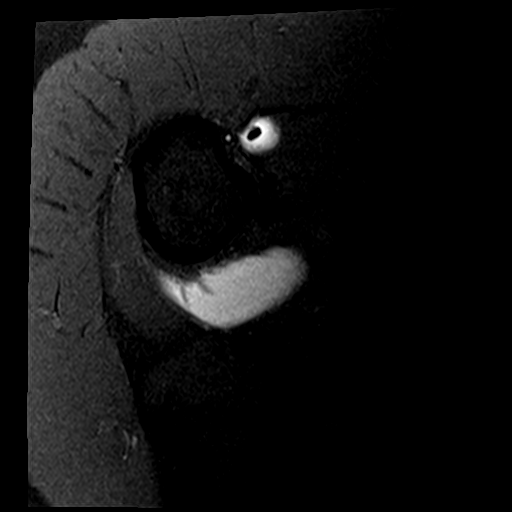
[im 5/18]
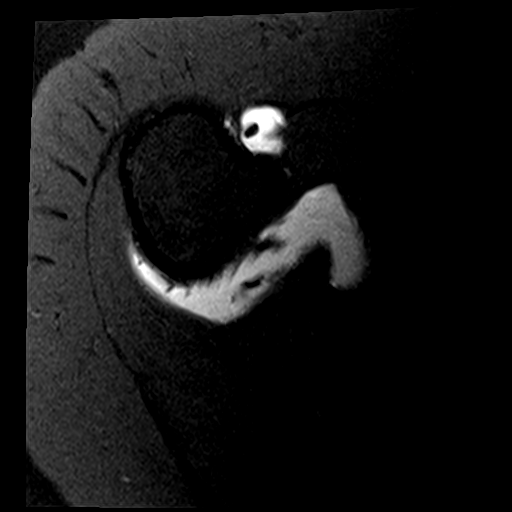
[im 8/18]
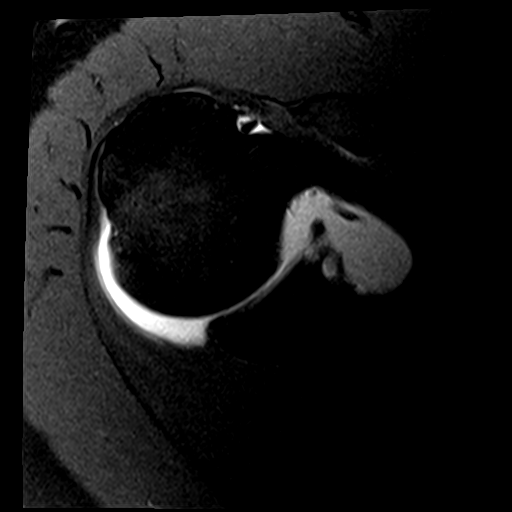
[im 10/18]
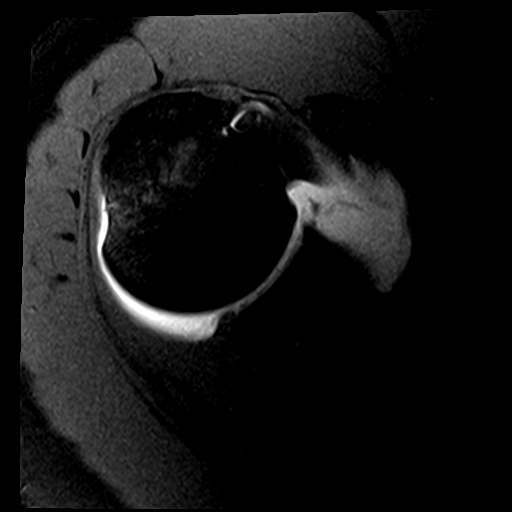
[im 13/18]
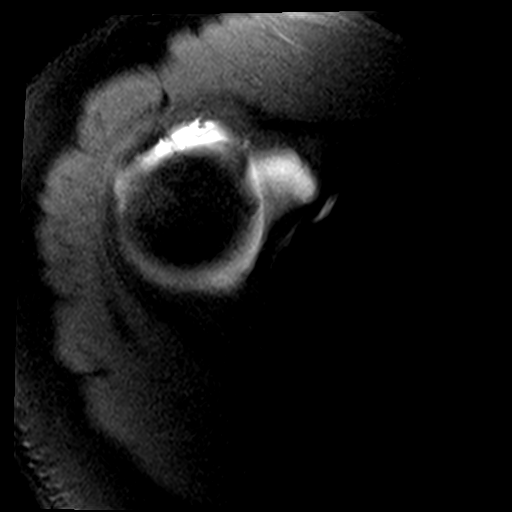
[im 15/18]
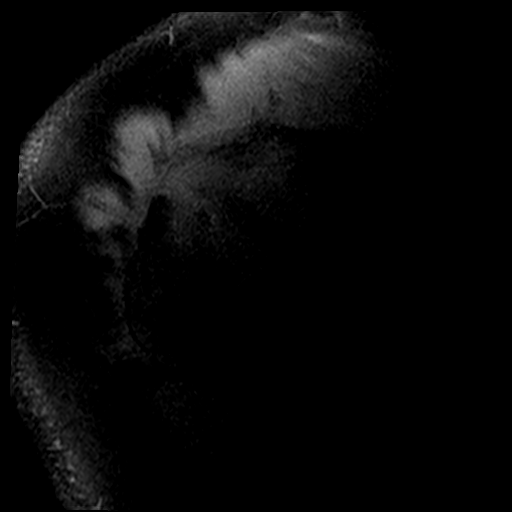
[im 18/18]
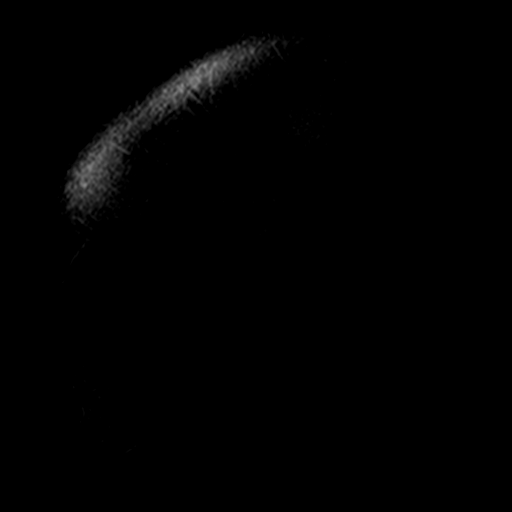

[Series 8: T1 fat-sat · oblique · 4.0mm · 0.62mm/px · 7 of 16 slices shown (2 of 3)]
[im 1/16]
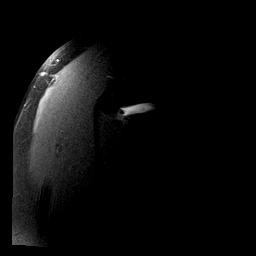
[im 3/16]
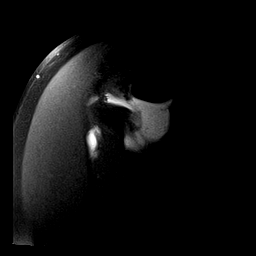
[im 6/16]
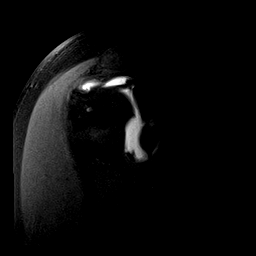
[im 8/16]
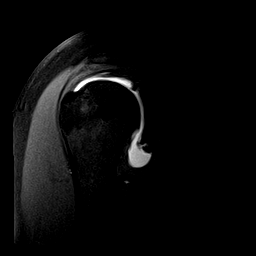
[im 11/16]
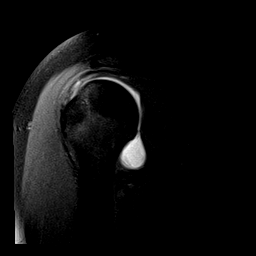
[im 13/16]
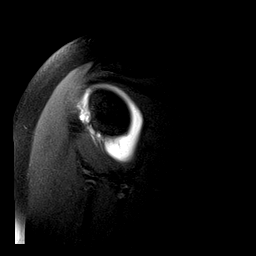
[im 16/16]
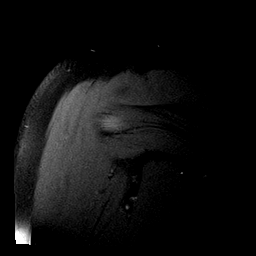

[Series 9: T2 · coronal · 4.0mm · 0.62mm/px · 7 of 18 slices shown]
[im 1/18]
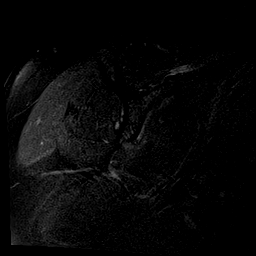
[im 3/18]
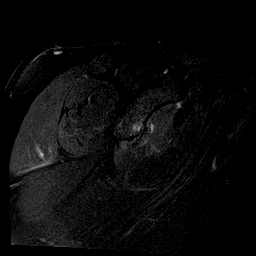
[im 6/18]
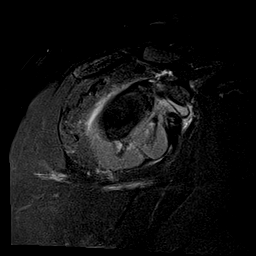
[im 9/18]
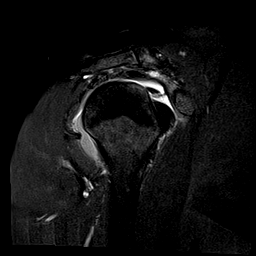
[im 12/18]
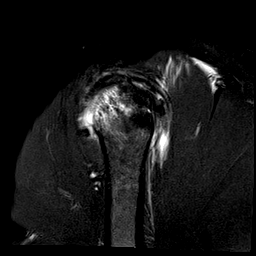
[im 15/18]
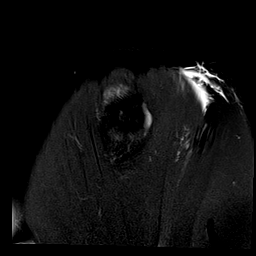
[im 18/18]
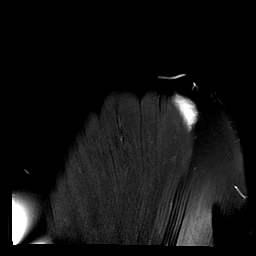

[Series 10: T1 · oblique · 4.0mm · 0.62mm/px · 6 of 16 slices shown]
[im 1/16]
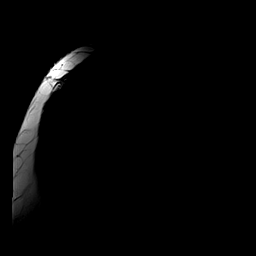
[im 4/16]
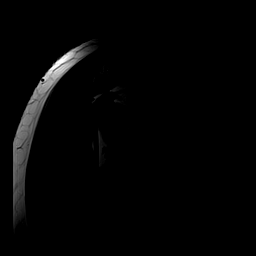
[im 7/16]
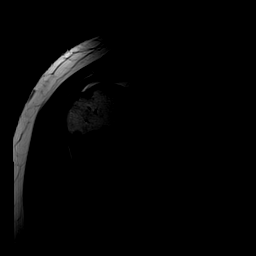
[im 10/16]
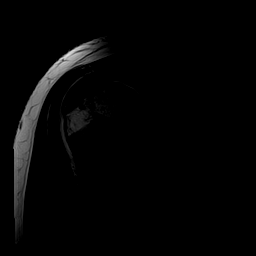
[im 13/16]
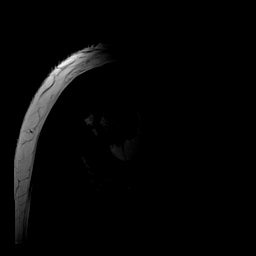
[im 16/16]
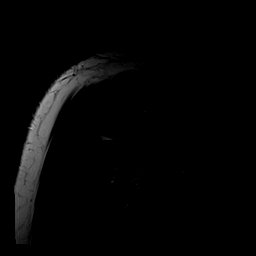

[Series 11: T2 fat-sat · oblique · 4.0mm · 0.59mm/px · 6 of 16 slices shown]
[im 1/16]
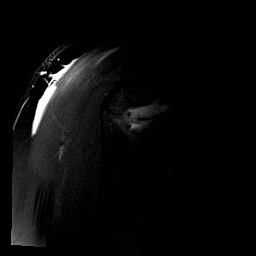
[im 4/16]
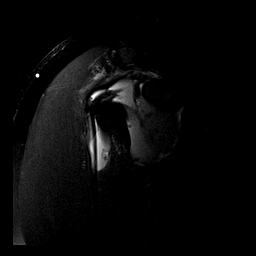
[im 7/16]
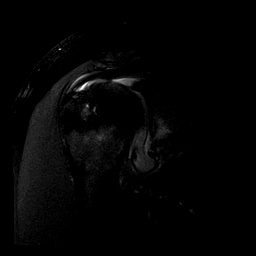
[im 10/16]
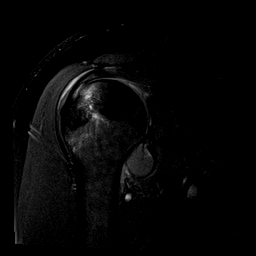
[im 13/16]
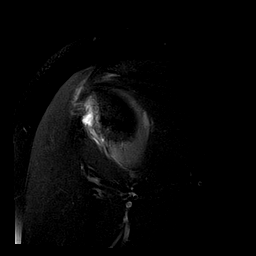
[im 16/16]
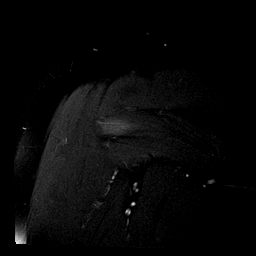

[Series 15: T1 fat-sat · sagittal · 4.0mm · 0.62mm/px · 6 of 16 slices shown (3 of 3)]
[im 1/16]
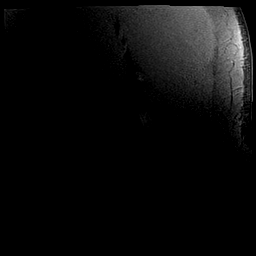
[im 4/16]
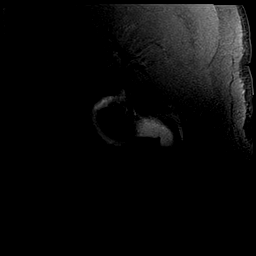
[im 7/16]
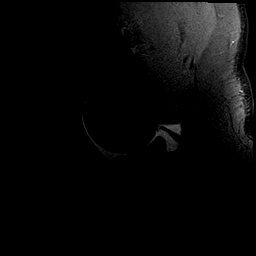
[im 10/16]
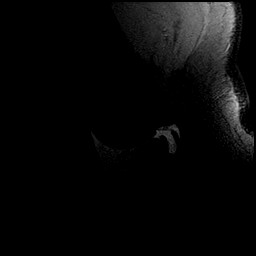
[im 13/16]
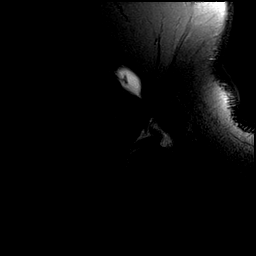
[im 16/16]
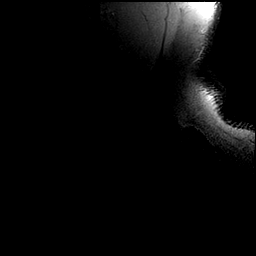

[40 of 40 positions shown; findings below may reference images not displayed]

FINDINGS: Rotator cuff: There is an extensive laminar type tear of the distal
supraspinatus tendon extending almost 3 cm in length. This is a
partial-thickness tear extending approximately 50% of the way
through the tendon. It is best visualized on the ABER review. The
rest of the rotator cuff is intact.

Muscles: No atrophy or edema.

Biceps long head: Properly located and intact.

Acromioclavicular Joint: Normal.  Type 2 acromion.  No bursitis.

Glenohumeral Joint:  (see below)

Labrum: There is an osseous Bankart lesion of anterior inferior
aspect of the glenoid. There is stripping of the anterior superior
aspect of the labrum as well as a displaced tear of the posterior
labrum. The labrum attached to the fractured portion of the glenoid
is still intact. There is also a SLAP tear.

Bones: Hill-Sachs lesion with subcortical edema.
IMPRESSION: 1. Osseous Bankart lesion secondary to transient anterior
dislocation of the right humeral head.
2. Extensive avulsion of inferior and anterior superior aspects of
the labrum as well as a SLAP tear.
3. Extensive delamination type partial thickness articular surface
tear of the distal supraspinatus tendon.

## 2016-06-02 DIAGNOSIS — E119 Type 2 diabetes mellitus without complications: Secondary | ICD-10-CM | POA: Diagnosis not present

## 2016-06-02 DIAGNOSIS — I10 Essential (primary) hypertension: Secondary | ICD-10-CM | POA: Diagnosis not present

## 2016-06-02 DIAGNOSIS — E038 Other specified hypothyroidism: Secondary | ICD-10-CM | POA: Diagnosis not present

## 2016-06-02 DIAGNOSIS — E782 Mixed hyperlipidemia: Secondary | ICD-10-CM | POA: Diagnosis not present

## 2016-06-02 DIAGNOSIS — Z8547 Personal history of malignant neoplasm of testis: Secondary | ICD-10-CM | POA: Diagnosis not present

## 2016-08-14 ENCOUNTER — Encounter: Payer: Self-pay | Admitting: Gastroenterology

## 2016-09-05 ENCOUNTER — Encounter: Payer: BLUE CROSS/BLUE SHIELD | Admitting: Gastroenterology

## 2016-09-12 ENCOUNTER — Encounter: Payer: BLUE CROSS/BLUE SHIELD | Admitting: Gastroenterology

## 2016-10-12 ENCOUNTER — Ambulatory Visit (AMBULATORY_SURGERY_CENTER): Payer: Self-pay | Admitting: *Deleted

## 2016-10-12 VITALS — Ht 70.0 in | Wt 294.2 lb

## 2016-10-12 DIAGNOSIS — Z1211 Encounter for screening for malignant neoplasm of colon: Secondary | ICD-10-CM

## 2016-10-12 MED ORDER — NA SULFATE-K SULFATE-MG SULF 17.5-3.13-1.6 GM/177ML PO SOLN: ORAL | 0 refills | Status: DC

## 2016-10-12 NOTE — Progress Notes (Signed)
No egg or soy allergy  No anesthesia or intubation problems per pt  No diet medications taken  No home oxygen used   

## 2016-10-16 ENCOUNTER — Encounter: Payer: Self-pay | Admitting: Gastroenterology

## 2016-10-18 ENCOUNTER — Encounter: Payer: BLUE CROSS/BLUE SHIELD | Admitting: Gastroenterology

## 2016-10-26 ENCOUNTER — Ambulatory Visit (AMBULATORY_SURGERY_CENTER): Payer: BLUE CROSS/BLUE SHIELD | Admitting: Gastroenterology

## 2016-10-26 ENCOUNTER — Encounter: Payer: Self-pay | Admitting: Gastroenterology

## 2016-10-26 VITALS — BP 143/102 | HR 75 | Temp 95.3°F | Resp 15 | Ht 70.0 in | Wt 294.0 lb

## 2016-10-26 DIAGNOSIS — Z1211 Encounter for screening for malignant neoplasm of colon: Secondary | ICD-10-CM | POA: Diagnosis present

## 2016-10-26 DIAGNOSIS — Z1212 Encounter for screening for malignant neoplasm of rectum: Secondary | ICD-10-CM | POA: Diagnosis not present

## 2016-10-26 LAB — GLUCOSE, CAPILLARY
GLUCOSE-CAPILLARY: 165 mg/dL — AB (ref 65–99)
Glucose-Capillary: 126 mg/dL — ABNORMAL HIGH (ref 65–99)

## 2016-10-26 MED ORDER — SODIUM CHLORIDE 0.9 % IV SOLN: 500.0000 mL | INTRAVENOUS | Status: DC

## 2016-10-26 NOTE — Patient Instructions (Signed)
YOU HAD AN ENDOSCOPIC PROCEDURE TODAY AT [facility]:   Refer to the procedure report that was given to you for any specific questions about what was found during the examination.  If the procedure report does not answer your questions, please call your gastroenterologist to clarify.  If you requested that your care partner not be given the details of your procedure findings, then the procedure report has been included in a sealed envelope for you to review at your convenience later.  YOU SHOULD EXPECT: Some feelings of bloating in the abdomen. Passage of more gas than usual.  Walking can help get rid of the air that was put into your GI tract during the procedure and reduce the bloating. If you had a lower endoscopy (such as a colonoscopy or flexible sigmoidoscopy) you may notice spotting of blood in your stool or on the toilet paper. If you underwent a bowel prep for your procedure, you may not have a normal bowel movement for a few days.  Please Note:  You might notice some irritation and congestion in your nose or some drainage.  This is from the oxygen used during your procedure.  There is no need for concern and it should clear up in a day or so.  SYMPTOMS TO REPORT IMMEDIATELY:   Following lower endoscopy (colonoscopy or flexible sigmoidoscopy):  Excessive amounts of blood in the stool  Significant tenderness or worsening of abdominal pains  Swelling of the abdomen that is new, acute  Fever of 100F or higher   Following upper endoscopy (EGD)  Vomiting of blood or coffee ground material  New chest pain or pain under the shoulder blades  Painful or persistently difficult swallowing  New shortness of breath  Fever of 100F or higher  Black, tarry-looking stools  For urgent or emergent issues, a gastroenterologist can be reached at any hour by calling [phone].   DIET:  We do recommend a small meal at first, but then you may proceed to your regular diet.  Drink  plenty of fluids but you should avoid alcoholic beverages for 24 hours.  ACTIVITY:  You should plan to take it easy for the rest of today and you should NOT DRIVE or use heavy machinery until tomorrow (because of the sedation medicines used during the test).    FOLLOW UP: Our staff will call the number listed on your records the next business day following your procedure to check on you and address any questions or concerns that you may have regarding the information given to you following your procedure. If we do not reach you, we will leave a message.  However, if you are feeling well and you are not experiencing any problems, there is no need to return our call.  We will assume that you have returned to your regular daily activities without incident.  If any biopsies were taken you will be contacted by phone or by letter within the next 1-3 weeks.  Please call us at [phone] if you have not heard about the biopsies in 3 weeks.    SIGNATURES/CONFIDENTIALITY: You and/or your care partner have signed paperwork which will be entered into your electronic medical record.  These signatures attest to the fact that that the information above on your After Visit Summary has been reviewed and is understood.  Full responsibility of the confidentiality of this discharge information lies with you and/or your care-partner.  Colonoscopy rescheduled,  Two day bowel prep.

## 2016-10-26 NOTE — Progress Notes (Signed)
A/ox3 pleased with MAC, report to Jane RN 

## 2016-10-26 NOTE — Op Note (Signed)
[facility] Patient Name: Nathaniel Bonilla Procedure Date: 10/26/2016 9:13 AM MRN: [id number] Endoscopist: Mauri Pole , MD Age: 55 Referring MD:  Date of Birth: [birth year] Gender: Male Account #: [id number] Procedure:                Colonoscopy Indications:              Screening for colorectal malignant neoplasm, This                            is the patient's first colonoscopy Medicines:                Monitored Anesthesia Care Procedure:                Pre-Anesthesia Assessment:                           - Prior to the procedure, a History and Physical                            was performed, and patient medications and                            allergies were reviewed. The patient's tolerance of                            previous anesthesia was also reviewed. The risks                            and benefits of the procedure and the sedation                            options and risks were discussed with the patient.                            All questions were answered, and informed consent                            was obtained. Prior Anticoagulants: The patient has                            taken no previous anticoagulant or antiplatelet                            agents. ASA Grade Assessment: II - A patient with                            mild systemic disease. After reviewing the risks                            and benefits, the patient was deemed in                            satisfactory condition to undergo the procedure.  After obtaining informed consent, the colonoscope                            was passed under direct vision. Throughout the                            procedure, the patient's blood pressure, pulse, and                            oxygen saturations were monitored continuously. The                            Model CF-HQ190L [id number]) scope was introduced                            through the anus and  advanced to the the transverse                            colon for evaluation. This was the intended extent.                            The colonoscopy was technically difficult and                            complex due to poor bowel prep with stool present.                            The patient tolerated the procedure well. The                            quality of the bowel preparation was poor. Scope In: 9:25:53 AM Scope Out: 9:31:27 AM Total Procedure Duration: 0 hours 5 minutes 34 seconds  Findings:                 The perianal and digital rectal examinations were                            normal.                           Copious quantities of semi-liquid semi-solid stool                            with fiber and seeds was found in the entire colon,                            interfering with visualization and was clogging the                            scope, unable to lavage or suction. Procedure was                            aborted Complications:            No immediate complications.  Estimated Blood Loss:     Estimated blood loss: none. Impression:               - Preparation of the colon was poor.                           - Stool in the entire examined colon.                           - No specimens collected. Recommendation:           - Patient has a contact number available for                            emergencies. The signs and symptoms of potential                            delayed complications were discussed with the                            patient. Return to normal activities tomorrow.                            Written discharge instructions were provided to the                            patient.                           - Resume previous diet.                           - Continue present medications.                           - Repeat colonoscopy at the next available                            appointment with extended bowel prep because the                             bowel preparation was suboptimal. Mauri Pole, MD 10/26/2016 9:37:01 AM This report has been signed electronically.

## 2016-10-27 ENCOUNTER — Telehealth: Payer: Self-pay | Admitting: *Deleted

## 2016-10-27 NOTE — Telephone Encounter (Signed)
  Follow up Call-  Call back number 10/26/2016  Post procedure Call Back phone  # [phone]  Permission to leave phone message Yes  Some recent data might be hidden     No answer left message and will attempt to call back later for follow up call. SM

## 2016-11-13 ENCOUNTER — Ambulatory Visit (AMBULATORY_SURGERY_CENTER): Payer: Self-pay

## 2016-11-13 VITALS — Ht 70.0 in | Wt 291.0 lb

## 2016-11-13 DIAGNOSIS — Z1211 Encounter for screening for malignant neoplasm of colon: Secondary | ICD-10-CM

## 2016-11-13 MED ORDER — SUPREP BOWEL PREP KIT 17.5-3.13-1.6 GM/177ML PO SOLN: 1.0000 | Freq: Once | ORAL | 0 refills | Status: AC

## 2016-11-13 NOTE — Progress Notes (Signed)
No allergies to eggs or soy No past problems with anesthesia No home oxygen No diet meds  Declined emmi 

## 2016-11-24 ENCOUNTER — Ambulatory Visit (AMBULATORY_SURGERY_CENTER): Payer: BLUE CROSS/BLUE SHIELD | Admitting: Gastroenterology

## 2016-11-24 ENCOUNTER — Encounter: Payer: Self-pay | Admitting: Gastroenterology

## 2016-11-24 VITALS — BP 108/68 | HR 76 | Temp 97.1°F | Resp 16 | Ht 70.0 in | Wt 291.0 lb

## 2016-11-24 DIAGNOSIS — Z1212 Encounter for screening for malignant neoplasm of rectum: Secondary | ICD-10-CM | POA: Diagnosis not present

## 2016-11-24 DIAGNOSIS — K621 Rectal polyp: Secondary | ICD-10-CM | POA: Diagnosis not present

## 2016-11-24 DIAGNOSIS — Z1211 Encounter for screening for malignant neoplasm of colon: Secondary | ICD-10-CM

## 2016-11-24 DIAGNOSIS — D128 Benign neoplasm of rectum: Secondary | ICD-10-CM

## 2016-11-24 DIAGNOSIS — D129 Benign neoplasm of anus and anal canal: Secondary | ICD-10-CM

## 2016-11-24 DIAGNOSIS — D123 Benign neoplasm of transverse colon: Secondary | ICD-10-CM | POA: Diagnosis not present

## 2016-11-24 HISTORY — PX: COLONOSCOPY: SHX174

## 2016-11-24 LAB — GLUCOSE, CAPILLARY
GLUCOSE-CAPILLARY: 97 mg/dL (ref 65–99)
Glucose-Capillary: 81 mg/dL (ref 65–99)

## 2016-11-24 MED ORDER — SODIUM CHLORIDE 0.9 % IV SOLN: 500.0000 mL | INTRAVENOUS | Status: DC

## 2016-11-24 NOTE — Op Note (Signed)
[facility] Patient Name: Nathaniel Bonilla Procedure Date: 11/24/2016 4:35 PM MRN: [id number] Endoscopist: Mauri Pole , MD Age: 56 Referring MD:  Date of Birth: 1961/05/01 Gender: Male Account #: [id number] Procedure:                Colonoscopy Indications:              Screening for malignant neoplasm in the rectum Medicines:                Monitored Anesthesia Care Procedure:                Pre-Anesthesia Assessment:                           - Prior to the procedure, a History and Physical                            was performed, and patient medications and                            allergies were reviewed. The patient's tolerance of                            previous anesthesia was also reviewed. The risks                            and benefits of the procedure and the sedation                            options and risks were discussed with the patient.                            All questions were answered, and informed consent                            was obtained. Prior Anticoagulants: The patient has                            taken no previous anticoagulant or antiplatelet                            agents. ASA Grade Assessment: III - A patient with                            severe systemic disease. After reviewing the risks                            and benefits, the patient was deemed in                            satisfactory condition to undergo the procedure.                           After obtaining informed consent, the colonoscope  was passed under direct vision. Throughout the                            procedure, the patient's blood pressure, pulse, and                            oxygen saturations were monitored continuously. The                            Model CF-HQ190L [id number]) scope was introduced                            through the anus and advanced to the the cecum,                            identified  by appendiceal orifice and ileocecal                            valve. The colonoscopy was performed without                            difficulty. The patient tolerated the procedure                            well. The quality of the bowel preparation was                            excellent. The ileocecal valve, appendiceal                            orifice, and rectum were photographed. Scope In: 4:42:50 PM Scope Out: 5:16:03 PM Scope Withdrawal Time: 0 hours 24 minutes 55 seconds  Total Procedure Duration: 0 hours 33 minutes 13 seconds  Findings:                 The perianal and digital rectal examinations were                            normal.                           A 15 mm polyp was found in the transverse colon.                            The polyp was pedunculated. The polyp was removed                            with a hot snare. Resection and retrieval were                            complete.                           A 5 mm polyp was found in the transverse colon. The  polyp was sessile. The polyp was removed with a                            cold snare. Resection and retrieval were complete.                           A 2 mm polyp was found in the rectum. The polyp was                            sessile. The polyp was removed with a cold biopsy                            forceps. Resection and retrieval were complete.                           Scattered small-mouthed diverticula were found in                            the descending colon and transverse colon. Complications:            No immediate complications. Estimated Blood Loss:     Estimated blood loss was minimal. Impression:               - One 15 mm polyp in the transverse colon, removed                            with a hot snare. Resected and retrieved.                           - One 5 mm polyp in the transverse colon, removed                            with a cold snare. Resected  and retrieved.                           - One 2 mm polyp in the rectum, removed with a cold                            biopsy forceps. Resected and retrieved.                           - Diverticulosis in the descending colon and in the                            transverse colon. Recommendation:           - Patient has a contact number available for                            emergencies. The signs and symptoms of potential                            delayed complications were discussed with the  patient. Return to normal activities tomorrow.                            Written discharge instructions were provided to the                            patient.                           - Resume previous diet.                           - Continue present medications.                           - Await pathology results.                           - Repeat colonoscopy in 3 years for surveillance                            based on pathology results. Mauri Pole, MD 11/24/2016 5:20:34 PM This report has been signed electronically.

## 2016-11-24 NOTE — Patient Instructions (Signed)
Impression/Recommendations:  Polyp handout given to patient. Diverticulosis handout given to patient.  Repeat colonoscopy in 3 years for surveillance.  YOU HAD AN ENDOSCOPIC PROCEDURE TODAY AT [facility]:   Refer to the procedure report that was given to you for any specific questions about what was found during the examination.  If the procedure report does not answer your questions, please call your gastroenterologist to clarify.  If you requested that your care partner not be given the details of your procedure findings, then the procedure report has been included in a sealed envelope for you to review at your convenience later.  YOU SHOULD EXPECT: Some feelings of bloating in the abdomen. Passage of more gas than usual.  Walking can help get rid of the air that was put into your GI tract during the procedure and reduce the bloating. If you had a lower endoscopy (such as a colonoscopy or flexible sigmoidoscopy) you may notice spotting of blood in your stool or on the toilet paper. If you underwent a bowel prep for your procedure, you may not have a normal bowel movement for a few days.  Please Note:  You might notice some irritation and congestion in your nose or some drainage.  This is from the oxygen used during your procedure.  There is no need for concern and it should clear up in a day or so.  SYMPTOMS TO REPORT IMMEDIATELY:   Following lower endoscopy (colonoscopy or flexible sigmoidoscopy):  Excessive amounts of blood in the stool  Significant tenderness or worsening of abdominal pains  Swelling of the abdomen that is new, acute  Fever of 100F or higher For urgent or emergent issues, a gastroenterologist can be reached at any hour by calling [phone].   DIET:  We do recommend a small meal at first, but then you may proceed to your regular diet.  Drink plenty of fluids but you should avoid alcoholic beverages for 24 hours.  ACTIVITY:  You should plan to  take it easy for the rest of today and you should NOT DRIVE or use heavy machinery until tomorrow (because of the sedation medicines used during the test).    FOLLOW UP: Our staff will call the number listed on your records the next business day following your procedure to check on you and address any questions or concerns that you may have regarding the information given to you following your procedure. If we do not reach you, we will leave a message.  However, if you are feeling well and you are not experiencing any problems, there is no need to return our call.  We will assume that you have returned to your regular daily activities without incident.  If any biopsies were taken you will be contacted by phone or by letter within the next 1-3 weeks.  Please call us at [phone] if you have not heard about the biopsies in 3 weeks.    SIGNATURES/CONFIDENTIALITY: You and/or your care partner have signed paperwork which will be entered into your electronic medical record.  These signatures attest to the fact that that the information above on your After Visit Summary has been reviewed and is understood.  Full responsibility of the confidentiality of this discharge information lies with you and/or your care-partner.

## 2016-11-24 NOTE — Progress Notes (Signed)
A/ox3 pleased with MAC, report to Jane RN 

## 2016-11-27 ENCOUNTER — Telehealth: Payer: Self-pay

## 2016-11-27 NOTE — Telephone Encounter (Signed)
  Follow up Call-  Call back number 11/24/2016 10/26/2016  Post procedure Call Back phone  # [phone]  Permission to leave phone message Yes Yes  Some recent data might be hidden    Patient was called for follow up after his procedure on 11/24/2016. No answer at the number given for follow up phone call. A message was left on the answering machine.

## 2016-11-27 NOTE — Telephone Encounter (Signed)
  Follow up Call-  Call back number 11/24/2016 10/26/2016  Post procedure Call Back phone  # [phone]  Permission to leave phone message Yes Yes  Some recent data might be hidden    Patient was called for follow up after his procedure on 11/24/2016. No answer at the number given for follow up phone call. A message was left on voice mail.

## 2016-12-04 ENCOUNTER — Encounter: Payer: Self-pay | Admitting: Gastroenterology

## 2016-12-04 DIAGNOSIS — E782 Mixed hyperlipidemia: Secondary | ICD-10-CM | POA: Diagnosis not present

## 2016-12-04 DIAGNOSIS — E119 Type 2 diabetes mellitus without complications: Secondary | ICD-10-CM | POA: Diagnosis not present

## 2016-12-04 DIAGNOSIS — Z1389 Encounter for screening for other disorder: Secondary | ICD-10-CM | POA: Diagnosis not present

## 2016-12-04 DIAGNOSIS — E038 Other specified hypothyroidism: Secondary | ICD-10-CM | POA: Diagnosis not present

## 2016-12-04 DIAGNOSIS — Z8547 Personal history of malignant neoplasm of testis: Secondary | ICD-10-CM | POA: Diagnosis not present

## 2017-03-14 DIAGNOSIS — G4733 Obstructive sleep apnea (adult) (pediatric): Secondary | ICD-10-CM | POA: Diagnosis not present

## 2017-05-22 DIAGNOSIS — H25013 Cortical age-related cataract, bilateral: Secondary | ICD-10-CM | POA: Diagnosis not present

## 2017-05-22 DIAGNOSIS — H40021 Open angle with borderline findings, high risk, right eye: Secondary | ICD-10-CM | POA: Diagnosis not present

## 2017-05-22 DIAGNOSIS — H35033 Hypertensive retinopathy, bilateral: Secondary | ICD-10-CM | POA: Diagnosis not present

## 2017-05-22 DIAGNOSIS — E119 Type 2 diabetes mellitus without complications: Secondary | ICD-10-CM | POA: Diagnosis not present

## 2017-05-22 DIAGNOSIS — H5213 Myopia, bilateral: Secondary | ICD-10-CM | POA: Diagnosis not present

## 2017-05-22 DIAGNOSIS — H40023 Open angle with borderline findings, high risk, bilateral: Secondary | ICD-10-CM | POA: Diagnosis not present

## 2017-05-22 DIAGNOSIS — H40022 Open angle with borderline findings, high risk, left eye: Secondary | ICD-10-CM | POA: Diagnosis not present

## 2017-07-06 DIAGNOSIS — G4733 Obstructive sleep apnea (adult) (pediatric): Secondary | ICD-10-CM | POA: Diagnosis not present

## 2017-07-06 DIAGNOSIS — E038 Other specified hypothyroidism: Secondary | ICD-10-CM | POA: Diagnosis not present

## 2017-07-06 DIAGNOSIS — E119 Type 2 diabetes mellitus without complications: Secondary | ICD-10-CM | POA: Diagnosis not present

## 2017-07-06 DIAGNOSIS — I10 Essential (primary) hypertension: Secondary | ICD-10-CM | POA: Diagnosis not present

## 2017-09-05 DIAGNOSIS — G4733 Obstructive sleep apnea (adult) (pediatric): Secondary | ICD-10-CM | POA: Diagnosis not present

## 2018-02-22 DIAGNOSIS — I10 Essential (primary) hypertension: Secondary | ICD-10-CM | POA: Diagnosis not present

## 2018-02-22 DIAGNOSIS — E1169 Type 2 diabetes mellitus with other specified complication: Secondary | ICD-10-CM | POA: Diagnosis not present

## 2018-02-22 DIAGNOSIS — Z1389 Encounter for screening for other disorder: Secondary | ICD-10-CM | POA: Diagnosis not present

## 2018-02-22 DIAGNOSIS — E782 Mixed hyperlipidemia: Secondary | ICD-10-CM | POA: Diagnosis not present

## 2018-03-15 DIAGNOSIS — E1169 Type 2 diabetes mellitus with other specified complication: Secondary | ICD-10-CM | POA: Diagnosis not present

## 2018-03-15 DIAGNOSIS — Z6841 Body Mass Index (BMI) 40.0 and over, adult: Secondary | ICD-10-CM | POA: Diagnosis not present

## 2018-03-15 DIAGNOSIS — I10 Essential (primary) hypertension: Secondary | ICD-10-CM | POA: Diagnosis not present

## 2018-04-10 DIAGNOSIS — I10 Essential (primary) hypertension: Secondary | ICD-10-CM | POA: Diagnosis not present

## 2018-04-10 DIAGNOSIS — E1169 Type 2 diabetes mellitus with other specified complication: Secondary | ICD-10-CM | POA: Diagnosis not present

## 2018-04-10 DIAGNOSIS — Z6841 Body Mass Index (BMI) 40.0 and over, adult: Secondary | ICD-10-CM | POA: Diagnosis not present

## 2018-05-23 DIAGNOSIS — Z794 Long term (current) use of insulin: Secondary | ICD-10-CM | POA: Diagnosis not present

## 2018-05-23 DIAGNOSIS — I10 Essential (primary) hypertension: Secondary | ICD-10-CM | POA: Diagnosis not present

## 2018-05-23 DIAGNOSIS — E1169 Type 2 diabetes mellitus with other specified complication: Secondary | ICD-10-CM | POA: Diagnosis not present

## 2018-06-04 DIAGNOSIS — E119 Type 2 diabetes mellitus without complications: Secondary | ICD-10-CM | POA: Diagnosis not present

## 2018-06-04 DIAGNOSIS — H25013 Cortical age-related cataract, bilateral: Secondary | ICD-10-CM | POA: Diagnosis not present

## 2018-06-04 DIAGNOSIS — H35033 Hypertensive retinopathy, bilateral: Secondary | ICD-10-CM | POA: Diagnosis not present

## 2018-06-04 DIAGNOSIS — H40023 Open angle with borderline findings, high risk, bilateral: Secondary | ICD-10-CM | POA: Diagnosis not present

## 2018-09-20 DIAGNOSIS — E782 Mixed hyperlipidemia: Secondary | ICD-10-CM | POA: Diagnosis not present

## 2018-09-20 DIAGNOSIS — G629 Polyneuropathy, unspecified: Secondary | ICD-10-CM | POA: Diagnosis not present

## 2018-09-20 DIAGNOSIS — Z23 Encounter for immunization: Secondary | ICD-10-CM | POA: Diagnosis not present

## 2018-09-20 DIAGNOSIS — I10 Essential (primary) hypertension: Secondary | ICD-10-CM | POA: Diagnosis not present

## 2018-09-20 DIAGNOSIS — E038 Other specified hypothyroidism: Secondary | ICD-10-CM | POA: Diagnosis not present

## 2018-09-20 DIAGNOSIS — G6289 Other specified polyneuropathies: Secondary | ICD-10-CM | POA: Diagnosis not present

## 2018-09-20 DIAGNOSIS — E1169 Type 2 diabetes mellitus with other specified complication: Secondary | ICD-10-CM | POA: Diagnosis not present

## 2019-08-29 DIAGNOSIS — Z23 Encounter for immunization: Secondary | ICD-10-CM | POA: Diagnosis not present

## 2019-10-22 DIAGNOSIS — G4733 Obstructive sleep apnea (adult) (pediatric): Secondary | ICD-10-CM | POA: Diagnosis not present

## 2019-11-20 DIAGNOSIS — E039 Hypothyroidism, unspecified: Secondary | ICD-10-CM | POA: Diagnosis not present

## 2019-11-20 DIAGNOSIS — Z0000 Encounter for general adult medical examination without abnormal findings: Secondary | ICD-10-CM | POA: Diagnosis not present

## 2019-11-20 DIAGNOSIS — Z125 Encounter for screening for malignant neoplasm of prostate: Secondary | ICD-10-CM | POA: Diagnosis not present

## 2019-11-20 DIAGNOSIS — E782 Mixed hyperlipidemia: Secondary | ICD-10-CM | POA: Diagnosis not present

## 2019-11-27 DIAGNOSIS — R82998 Other abnormal findings in urine: Secondary | ICD-10-CM | POA: Diagnosis not present

## 2019-11-27 DIAGNOSIS — I10 Essential (primary) hypertension: Secondary | ICD-10-CM | POA: Diagnosis not present

## 2019-11-27 DIAGNOSIS — Z1212 Encounter for screening for malignant neoplasm of rectum: Secondary | ICD-10-CM | POA: Diagnosis not present

## 2019-11-28 DIAGNOSIS — G629 Polyneuropathy, unspecified: Secondary | ICD-10-CM | POA: Diagnosis not present

## 2019-11-28 DIAGNOSIS — Z1331 Encounter for screening for depression: Secondary | ICD-10-CM | POA: Diagnosis not present

## 2019-11-28 DIAGNOSIS — I10 Essential (primary) hypertension: Secondary | ICD-10-CM | POA: Diagnosis not present

## 2019-11-28 DIAGNOSIS — E1169 Type 2 diabetes mellitus with other specified complication: Secondary | ICD-10-CM | POA: Diagnosis not present

## 2019-11-28 DIAGNOSIS — Z0000 Encounter for general adult medical examination without abnormal findings: Secondary | ICD-10-CM | POA: Diagnosis not present

## 2019-12-17 ENCOUNTER — Encounter: Payer: Self-pay | Admitting: Internal Medicine

## 2020-01-23 DIAGNOSIS — G4733 Obstructive sleep apnea (adult) (pediatric): Secondary | ICD-10-CM | POA: Diagnosis not present

## 2020-07-07 DIAGNOSIS — M5412 Radiculopathy, cervical region: Secondary | ICD-10-CM | POA: Diagnosis not present

## 2020-07-07 DIAGNOSIS — M25512 Pain in left shoulder: Secondary | ICD-10-CM | POA: Diagnosis not present

## 2020-07-09 DIAGNOSIS — B029 Zoster without complications: Secondary | ICD-10-CM | POA: Diagnosis not present

## 2020-07-13 ENCOUNTER — Ambulatory Visit: Payer: Self-pay | Admitting: Family Medicine

## 2020-07-13 NOTE — Progress Notes (Deleted)
    Subjective:    CC: Back pain  I, Jamason Peckham, LAT, ATC, am serving as scribe for Dr. Lynne Leader.  HPI: Pt is a 59 y/o male presenting w/ c/o back pain.  He locates his pain to .  Radiating pain: LE numbness/tingling: LE weakness: Aggravating factors: Treatments tried:  Pertinent review of Systems: ***  Relevant historical information: ***   Objective:   There were no vitals filed for this visit. General: Well Developed, well nourished, and in no acute distress.   MSK: ***  Lab and Radiology Results No results found for this or any previous visit (from the past 72 hour(s)). No results found.    Impression and Recommendations:    Assessment and Plan: 59 y.o. male with ***.  PDMP not reviewed this encounter. No orders of the defined types were placed in this encounter.  No orders of the defined types were placed in this encounter.   Discussed warning signs or symptoms. Please see discharge instructions. Patient expresses understanding.   ***

## 2020-12-07 DIAGNOSIS — E039 Hypothyroidism, unspecified: Secondary | ICD-10-CM | POA: Diagnosis not present

## 2020-12-07 DIAGNOSIS — Z125 Encounter for screening for malignant neoplasm of prostate: Secondary | ICD-10-CM | POA: Diagnosis not present

## 2020-12-07 DIAGNOSIS — E782 Mixed hyperlipidemia: Secondary | ICD-10-CM | POA: Diagnosis not present

## 2020-12-07 DIAGNOSIS — H5213 Myopia, bilateral: Secondary | ICD-10-CM | POA: Diagnosis not present

## 2020-12-07 DIAGNOSIS — E1169 Type 2 diabetes mellitus with other specified complication: Secondary | ICD-10-CM | POA: Diagnosis not present

## 2020-12-07 DIAGNOSIS — H40023 Open angle with borderline findings, high risk, bilateral: Secondary | ICD-10-CM | POA: Diagnosis not present

## 2020-12-07 DIAGNOSIS — H2513 Age-related nuclear cataract, bilateral: Secondary | ICD-10-CM | POA: Diagnosis not present

## 2020-12-07 DIAGNOSIS — Z23 Encounter for immunization: Secondary | ICD-10-CM | POA: Diagnosis not present

## 2020-12-07 DIAGNOSIS — Z0000 Encounter for general adult medical examination without abnormal findings: Secondary | ICD-10-CM | POA: Diagnosis not present

## 2020-12-07 DIAGNOSIS — H25013 Cortical age-related cataract, bilateral: Secondary | ICD-10-CM | POA: Diagnosis not present

## 2020-12-07 DIAGNOSIS — I10 Essential (primary) hypertension: Secondary | ICD-10-CM | POA: Diagnosis not present

## 2020-12-07 DIAGNOSIS — Z8547 Personal history of malignant neoplasm of testis: Secondary | ICD-10-CM | POA: Diagnosis not present

## 2020-12-07 DIAGNOSIS — E119 Type 2 diabetes mellitus without complications: Secondary | ICD-10-CM | POA: Diagnosis not present

## 2021-01-05 DIAGNOSIS — Z1212 Encounter for screening for malignant neoplasm of rectum: Secondary | ICD-10-CM | POA: Diagnosis not present

## 2021-03-25 DIAGNOSIS — E1169 Type 2 diabetes mellitus with other specified complication: Secondary | ICD-10-CM | POA: Diagnosis not present

## 2021-03-25 DIAGNOSIS — I10 Essential (primary) hypertension: Secondary | ICD-10-CM | POA: Diagnosis not present

## 2021-03-25 DIAGNOSIS — G629 Polyneuropathy, unspecified: Secondary | ICD-10-CM | POA: Diagnosis not present

## 2021-04-09 DIAGNOSIS — M7989 Other specified soft tissue disorders: Secondary | ICD-10-CM | POA: Diagnosis not present

## 2021-04-12 DIAGNOSIS — M7989 Other specified soft tissue disorders: Secondary | ICD-10-CM | POA: Diagnosis not present

## 2021-04-12 DIAGNOSIS — R600 Localized edema: Secondary | ICD-10-CM | POA: Diagnosis not present

## 2021-04-20 DIAGNOSIS — M25561 Pain in right knee: Secondary | ICD-10-CM | POA: Diagnosis not present

## 2021-04-20 DIAGNOSIS — M79662 Pain in left lower leg: Secondary | ICD-10-CM | POA: Diagnosis not present

## 2021-05-20 ENCOUNTER — Ambulatory Visit (HOSPITAL_COMMUNITY)
Admission: RE | Admit: 2021-05-20 | Discharge: 2021-05-20 | Disposition: A | Discharge status: Home / Self Care | Payer: BC Managed Care – PPO | Source: Ambulatory Visit | Attending: Family Medicine | Admitting: Family Medicine

## 2021-05-20 ENCOUNTER — Other Ambulatory Visit (HOSPITAL_COMMUNITY): Payer: Self-pay | Admitting: Family Medicine

## 2021-05-20 ENCOUNTER — Other Ambulatory Visit (HOSPITAL_COMMUNITY): Payer: Self-pay | Admitting: Orthopedic Surgery

## 2021-05-20 ENCOUNTER — Other Ambulatory Visit: Payer: Self-pay

## 2021-05-20 DIAGNOSIS — M25512 Pain in left shoulder: Secondary | ICD-10-CM | POA: Insufficient documentation

## 2021-05-20 DIAGNOSIS — M79662 Pain in left lower leg: Secondary | ICD-10-CM | POA: Diagnosis not present

## 2021-05-20 DIAGNOSIS — M7989 Other specified soft tissue disorders: Secondary | ICD-10-CM | POA: Insufficient documentation

## 2021-05-20 DIAGNOSIS — M79645 Pain in left finger(s): Secondary | ICD-10-CM | POA: Diagnosis not present

## 2021-05-20 DIAGNOSIS — M25561 Pain in right knee: Secondary | ICD-10-CM | POA: Diagnosis not present

## 2021-05-20 NOTE — Progress Notes (Signed)
Lower extremity venous LT study completed.  Preliminary results relayed to Rip Harbour, MD.  See CV Proc for preliminary results report.   Darlin Coco, RDMS, RVT

## 2021-05-21 ENCOUNTER — Other Ambulatory Visit: Payer: Self-pay | Admitting: Family Medicine

## 2021-05-21 DIAGNOSIS — M25561 Pain in right knee: Secondary | ICD-10-CM

## 2021-08-12 DIAGNOSIS — G4733 Obstructive sleep apnea (adult) (pediatric): Secondary | ICD-10-CM | POA: Diagnosis not present

## 2021-11-11 DIAGNOSIS — E1169 Type 2 diabetes mellitus with other specified complication: Secondary | ICD-10-CM | POA: Diagnosis not present

## 2021-11-11 DIAGNOSIS — I10 Essential (primary) hypertension: Secondary | ICD-10-CM | POA: Diagnosis not present

## 2022-01-02 ENCOUNTER — Encounter: Payer: Self-pay | Admitting: Gastroenterology

## 2022-01-05 DIAGNOSIS — H25813 Combined forms of age-related cataract, bilateral: Secondary | ICD-10-CM | POA: Diagnosis not present

## 2022-01-05 DIAGNOSIS — H40023 Open angle with borderline findings, high risk, bilateral: Secondary | ICD-10-CM | POA: Diagnosis not present

## 2022-01-05 DIAGNOSIS — E119 Type 2 diabetes mellitus without complications: Secondary | ICD-10-CM | POA: Diagnosis not present

## 2022-01-05 DIAGNOSIS — H5213 Myopia, bilateral: Secondary | ICD-10-CM | POA: Diagnosis not present

## 2022-01-06 DIAGNOSIS — Z125 Encounter for screening for malignant neoplasm of prostate: Secondary | ICD-10-CM | POA: Diagnosis not present

## 2022-01-06 DIAGNOSIS — E1169 Type 2 diabetes mellitus with other specified complication: Secondary | ICD-10-CM | POA: Diagnosis not present

## 2022-01-06 DIAGNOSIS — E782 Mixed hyperlipidemia: Secondary | ICD-10-CM | POA: Diagnosis not present

## 2022-01-06 DIAGNOSIS — E039 Hypothyroidism, unspecified: Secondary | ICD-10-CM | POA: Diagnosis not present

## 2022-01-13 DIAGNOSIS — Z0000 Encounter for general adult medical examination without abnormal findings: Secondary | ICD-10-CM | POA: Diagnosis not present

## 2022-01-13 DIAGNOSIS — E1169 Type 2 diabetes mellitus with other specified complication: Secondary | ICD-10-CM | POA: Diagnosis not present

## 2022-01-13 DIAGNOSIS — R82998 Other abnormal findings in urine: Secondary | ICD-10-CM | POA: Diagnosis not present

## 2022-01-13 DIAGNOSIS — I10 Essential (primary) hypertension: Secondary | ICD-10-CM | POA: Diagnosis not present

## 2022-01-13 DIAGNOSIS — Z1212 Encounter for screening for malignant neoplasm of rectum: Secondary | ICD-10-CM | POA: Diagnosis not present

## 2022-03-17 ENCOUNTER — Encounter: Payer: BC Managed Care – PPO | Admitting: Gastroenterology

## 2022-04-25 DIAGNOSIS — G4733 Obstructive sleep apnea (adult) (pediatric): Secondary | ICD-10-CM | POA: Diagnosis not present

## 2022-04-28 ENCOUNTER — Ambulatory Visit (AMBULATORY_SURGERY_CENTER): Payer: BC Managed Care – PPO | Admitting: *Deleted

## 2022-04-28 VITALS — Ht 70.0 in | Wt 284.0 lb

## 2022-04-28 DIAGNOSIS — Z860100 Personal history of colon polyps, unspecified: Secondary | ICD-10-CM

## 2022-04-28 MED ORDER — NA SULFATE-K SULFATE-MG SULF 17.5-3.13-1.6 GM/177ML PO SOLN: 1.0000 | ORAL | 0 refills | Status: DC

## 2022-04-28 NOTE — Progress Notes (Unsigned)
Patient's pre-visit was done today over the phone with the patient. Name,DOB and address verified. Patient denies any allergies to Eggs and Soy. Patient denies any problems with anesthesia/sedation. Patient is not taking any diet pills or blood thinners. No home Oxygen. Insurance confirmed with patient.  Went over prep instructions with patient. Prep instructions sent to pt's MyChart -pt is aware. Patient understands to call us back with any questions or concerns. Patient is aware of our care-partner policy.   EMMI education assigned to the patient for the procedure, sent to [place].

## 2022-05-17 ENCOUNTER — Encounter: Payer: Self-pay | Admitting: Gastroenterology

## 2022-05-19 ENCOUNTER — Encounter: Payer: Self-pay | Admitting: Gastroenterology

## 2022-05-19 ENCOUNTER — Ambulatory Visit (AMBULATORY_SURGERY_CENTER): Payer: BC Managed Care – PPO | Admitting: Gastroenterology

## 2022-05-19 VITALS — BP 109/70 | HR 71 | Temp 98.0°F | Resp 11 | Ht 70.0 in | Wt 284.0 lb

## 2022-05-19 DIAGNOSIS — D125 Benign neoplasm of sigmoid colon: Secondary | ICD-10-CM

## 2022-05-19 DIAGNOSIS — Z09 Encounter for follow-up examination after completed treatment for conditions other than malignant neoplasm: Secondary | ICD-10-CM | POA: Diagnosis not present

## 2022-05-19 DIAGNOSIS — Z86010 Personal history of colonic polyps: Secondary | ICD-10-CM

## 2022-05-19 DIAGNOSIS — Z1211 Encounter for screening for malignant neoplasm of colon: Secondary | ICD-10-CM | POA: Diagnosis not present

## 2022-05-19 MED ORDER — SODIUM CHLORIDE 0.9 % IV SOLN: 500.0000 mL | INTRAVENOUS | Status: DC

## 2022-05-19 NOTE — Progress Notes (Signed)
[facility] History and Physical   Primary Care Physician:  Prince Solian, MD   Reason for Procedure:  History of adenomatous colon polyps  Plan:    Surveillance colonoscopy with possible interventions as needed     HPI: Nathaniel Bonilla is a very pleasant 61 y.o. male here for surveillance colonoscopy. Denies any nausea, vomiting, abdominal pain, melena or bright red blood per rectum  The risks and benefits as well as alternatives of endoscopic procedure(s) have been discussed and reviewed. All questions answered. The patient agrees to proceed.    Past Medical History:  Diagnosis Date   Allergy    seasonal   Diabetes mellitus without complication ([place])    Malignant neoplasm of other and unspecified testis 2005   testicular   Sleep apnea    cpap   Thyroid disease     Past Surgical History:  Procedure Laterality Date   COLONOSCOPY  11/24/2016   Dr.Mickenzie Stolar   POLYPECTOMY     SHOULDER ARTHROSCOPY WITH LABRAL REPAIR Right 12/28/2014   Procedure: SHOULDER ARTHROSCOPY WITH LABRAL REPAIR, GELENOID FIXATION;  Surgeon: Meredith Pel, MD;  Location: [facility];  Service: Orthopedics;  Laterality: Right;  RIGHT SHOULDER ARTHROSCOPY, GLENOID FIXATION, LABRAL REPAIR.   TESTICLE REMOVAL Left    WISDOM TOOTH EXTRACTION      Prior to Admission medications   Medication Sig Start Date End Date Taking? Authorizing Provider  aspirin EC 81 MG tablet Take 81 mg by mouth daily.   Yes [provider]  Empagliflozin (JARDIANCE PO) Take by mouth daily.   Yes [provider]  gabapentin (NEURONTIN) 300 MG capsule Take 300 mg by mouth 3 (three) times daily. 04/27/22  Yes [provider]  ibuprofen (ADVIL,MOTRIN) 200 MG tablet Take 800 mg by mouth daily as needed for headache (pain).    Yes [provider]  insulin degludec (TRESIBA FLEXTOUCH) 200 UNIT/ML FlexTouch Pen  04/04/21  Yes [provider]  irbesartan (AVAPRO) 75 MG tablet Take 75  mg by mouth daily after supper.  12/10/14  Yes [provider]  levothyroxine (SYNTHROID, LEVOTHROID) 50 MCG tablet Take 50 mcg by mouth daily before breakfast.  11/30/14  Yes [provider]  Liraglutide 18 MG/3ML SOPN Inject 1.8 mg into the skin at bedtime. Victoza   Yes [provider]  LYUMJEV KWIKPEN 100 UNIT/ML KwikPen 25 Units 3 (three) times daily. 03/08/22  Yes [provider]  metFORMIN (GLUCOPHAGE) 850 MG tablet Take 850 mg by mouth 2 (two) times daily with a meal.     Yes [provider]  rosuvastatin (CRESTOR) 10 MG tablet  09/20/16  Yes [provider]    Current Outpatient Medications  Medication Sig Dispense Refill   aspirin EC 81 MG tablet Take 81 mg by mouth daily.     Empagliflozin (JARDIANCE PO) Take by mouth daily.     gabapentin (NEURONTIN) 300 MG capsule Take 300 mg by mouth 3 (three) times daily.     ibuprofen (ADVIL,MOTRIN) 200 MG tablet Take 800 mg by mouth daily as needed for headache (pain).      insulin degludec (TRESIBA FLEXTOUCH) 200 UNIT/ML FlexTouch Pen      irbesartan (AVAPRO) 75 MG tablet Take 75 mg by mouth daily after supper.   6   levothyroxine (SYNTHROID, LEVOTHROID) 50 MCG tablet Take 50 mcg by mouth daily before breakfast.   6   Liraglutide 18 MG/3ML SOPN Inject 1.8 mg into the skin at bedtime. Victoza     LYUMJEV  KWIKPEN 100 UNIT/ML KwikPen 25 Units 3 (three) times daily.     metFORMIN (GLUCOPHAGE) 850 MG tablet Take 850 mg by mouth 2 (two) times daily with a meal.       rosuvastatin (CRESTOR) 10 MG tablet      Current Facility-Administered Medications  Medication Dose Route Frequency Provider Last Rate Last Admin   0.9 %  sodium chloride infusion  500 mL Intravenous Continuous Isatu Macinnes, Venia Minks, MD        Allergies as of 05/19/2022 - Review Complete 05/19/2022  Allergen Reaction Noted   Guaifenesin Other (See Comments)     Family History  Problem Relation Age of Onset   Glaucoma Mother     Stroke Father    Stroke Other    Glaucoma Other    Colon cancer Neg Hx    Esophageal cancer Neg Hx    Rectal cancer Neg Hx    Stomach cancer Neg Hx     Social History   Socioeconomic History   Marital status: Married    Spouse name: Not on file   Number of children: Not on file   Years of education: Not on file   Highest education level: Not on file  Occupational History   Not on file  Tobacco Use   Smoking status: Never   Smokeless tobacco: Never  Vaping Use   Vaping Use: Never used  Substance and Sexual Activity   Alcohol use: No    Alcohol/week: 0.0 standard drinks of alcohol   Drug use: No   Sexual activity: Not on file  Other Topics Concern   Not on file  Social History Narrative   Not on file   Social Determinants of Health   Financial Resource Strain: Not on file  Food Insecurity: Not on file  Transportation Needs: Not on file  Physical Activity: Not on file  Stress: Not on file  Social Connections: Not on file  Intimate Partner Violence: Not on file    Review of Systems:  All other review of systems negative except as mentioned in the HPI.  Physical Exam: Vital signs in last 24 hours: BP 135/82   Pulse 74   Temp 98 F (36.7 C)   Resp (!) 9   Ht '5\' 10"'$  (1.778 m)   Wt 284 lb (128.8 kg)   SpO2 100%   BMI 40.75 kg/m  General:   Alert, NAD Lungs:  Clear .   Heart:  Regular rate and rhythm Abdomen:  Soft, nontender and nondistended. Neuro/Psych:  Alert and cooperative. Normal mood and affect. A and O x 3  Reviewed labs, radiology imaging, old records and pertinent past GI work up  Patient is appropriate for planned procedure(s) and anesthesia in an ambulatory setting   K. Denzil Magnuson , MD [phone]

## 2022-05-19 NOTE — Op Note (Signed)
[name] Procedure Date: 05/19/2022 7:17 AM MRN: [id number] Endoscopist: Mauri Pole , MD Age: 61 Referring MD:  Date of Birth: 1961/08/03 Gender: Male Account #: [id number] Procedure:                Colonoscopy Indications:              High risk colon cancer surveillance: Personal                            history of colonic polyps, High risk colon cancer                            surveillance: Personal history of adenoma (10 mm or                            greater in size), High risk colon cancer                            surveillance: Personal history of multiple (3 or                            more) adenomas Medicines:                Monitored Anesthesia Care Procedure:                Pre-Anesthesia Assessment:                           - Prior to the procedure, a History and Physical                            was performed, and patient medications and                            allergies were reviewed. The patient's tolerance of                            previous anesthesia was also reviewed. The risks                            and benefits of the procedure and the sedation                            options and risks were discussed with the patient.                            All questions were answered, and informed consent                            was obtained. Prior Anticoagulants: The patient has                            taken no previous anticoagulant or antiplatelet  agents. ASA Grade Assessment: III - A patient with                            severe systemic disease. After reviewing the risks                            and benefits, the patient was deemed in                            satisfactory condition to undergo the procedure.                           After obtaining informed consent, the colonoscope                            was passed under direct vision. Throughout the                             procedure, the patient's blood pressure, pulse, and                            oxygen saturations were monitored continuously. The                            Olympus PCF-H190DL ([id number]) Colonoscope was                            introduced through the anus and advanced to the the                            cecum, identified by appendiceal orifice and                            ileocecal valve. The colonoscopy was performed                            without difficulty. The patient tolerated the                            procedure well. The quality of the bowel                            preparation was good. The ileocecal valve,                            appendiceal orifice, and rectum were photographed. Scope In: 8:15:08 AM Scope Out: 8:35:57 AM Scope Withdrawal Time: 0 hours 11 minutes 30 seconds  Total Procedure Duration: 0 hours 20 minutes 49 seconds  Findings:                 The perianal and digital rectal examinations were                            normal.  A 5 mm polyp was found in the sigmoid colon. The                            polyp was sessile. The polyp was removed with a                            cold snare. Resection and retrieval were complete.                           External and internal hemorrhoids were found during                            retroflexion. The hemorrhoids were medium-sized. Complications:            No immediate complications. Estimated Blood Loss:     Estimated blood loss was minimal. Impression:               - One 5 mm polyp in the sigmoid colon, removed with                            a cold snare. Resected and retrieved.                           - External and internal hemorrhoids. Recommendation:           - Patient has a contact number available for                            emergencies. The signs and symptoms of potential                            delayed complications were discussed with  the                            patient. Return to normal activities tomorrow.                            Written discharge instructions were provided to the                            patient.                           - Resume previous diet.                           - Continue present medications.                           - Await pathology results.                           - Repeat colonoscopy in 5 years for surveillance                            based on pathology results.  Mauri Pole, MD 05/19/2022 8:44:27 AM This report has been signed electronically.

## 2022-05-19 NOTE — Patient Instructions (Signed)
Resume previous diet and medications. Awaiting pathology results. Repeat Colonoscopy in 5 years for surveillance.  YOU HAD AN ENDOSCOPIC PROCEDURE TODAY AT [facility]:   Refer to the procedure report that was given to you for any specific questions about what was found during the examination.  If the procedure report does not answer your questions, please call your gastroenterologist to clarify.  If you requested that your care partner not be given the details of your procedure findings, then the procedure report has been included in a sealed envelope for you to review at your convenience later.  YOU SHOULD EXPECT: Some feelings of bloating in the abdomen. Passage of more gas than usual.  Walking can help get rid of the air that was put into your GI tract during the procedure and reduce the bloating. If you had a lower endoscopy (such as a colonoscopy or flexible sigmoidoscopy) you may notice spotting of blood in your stool or on the toilet paper. If you underwent a bowel prep for your procedure, you may not have a normal bowel movement for a few days.  Please Note:  You might notice some irritation and congestion in your nose or some drainage.  This is from the oxygen used during your procedure.  There is no need for concern and it should clear up in a day or so.  SYMPTOMS TO REPORT IMMEDIATELY:  Following lower endoscopy (colonoscopy or flexible sigmoidoscopy):  Excessive amounts of blood in the stool  Significant tenderness or worsening of abdominal pains  Swelling of the abdomen that is new, acute  Fever of 100F or higher  For urgent or emergent issues, a gastroenterologist can be reached at any hour by calling [phone]. Do not use MyChart messaging for urgent concerns.    DIET:  We do recommend a small meal at first, but then you may proceed to your regular diet.  Drink plenty of fluids but you should avoid alcoholic beverages for 24 hours.  ACTIVITY:  You should  plan to take it easy for the rest of today and you should NOT DRIVE or use heavy machinery until tomorrow (because of the sedation medicines used during the test).    FOLLOW UP: Our staff will call the number listed on your records the next business day following your procedure.  We will call around 7:15- 8:00 am to check on you and address any questions or concerns that you may have regarding the information given to you following your procedure. If we do not reach you, we will leave a message.  If you develop any symptoms (ie: fever, flu-like symptoms, shortness of breath, cough etc.) before then, please call [phone].  If you test positive for Covid 19 in the 2 weeks post procedure, please call and report this information to [place].    If any biopsies were taken you will be contacted by phone or by letter within the next 1-3 weeks.  Please call us at [phone] if you have not heard about the biopsies in 3 weeks.    SIGNATURES/CONFIDENTIALITY: You and/or your care partner have signed paperwork which will be entered into your electronic medical record.  These signatures attest to the fact that that the information above on your After Visit Summary has been reviewed and is understood.  Full responsibility of the confidentiality of this discharge information lies with you and/or your care-partner.

## 2022-05-19 NOTE — Progress Notes (Signed)
A and O x3. Report to RN. Tolerated MAC anesthesia well. 

## 2022-05-19 NOTE — Progress Notes (Signed)
Called to room to assist during endoscopic procedure.  Patient ID and intended procedure confirmed with present staff. Received instructions for my participation in the procedure from the performing physician.  

## 2022-05-22 ENCOUNTER — Telehealth: Payer: Self-pay | Admitting: *Deleted

## 2022-05-22 NOTE — Telephone Encounter (Signed)
Attempted to call patient for their post-procedure follow-up call. No answer. Left voicemail.   

## 2022-05-25 DIAGNOSIS — G4733 Obstructive sleep apnea (adult) (pediatric): Secondary | ICD-10-CM | POA: Diagnosis not present

## 2022-06-19 ENCOUNTER — Encounter: Payer: Self-pay | Admitting: Gastroenterology

## 2022-06-25 DIAGNOSIS — G4733 Obstructive sleep apnea (adult) (pediatric): Secondary | ICD-10-CM | POA: Diagnosis not present

## 2022-07-26 DIAGNOSIS — G4733 Obstructive sleep apnea (adult) (pediatric): Secondary | ICD-10-CM | POA: Diagnosis not present

## 2022-08-25 DIAGNOSIS — G4733 Obstructive sleep apnea (adult) (pediatric): Secondary | ICD-10-CM | POA: Diagnosis not present

## 2022-09-05 DIAGNOSIS — B349 Viral infection, unspecified: Secondary | ICD-10-CM | POA: Diagnosis not present

## 2022-09-05 DIAGNOSIS — R051 Acute cough: Secondary | ICD-10-CM | POA: Diagnosis not present

## 2022-09-05 DIAGNOSIS — R0981 Nasal congestion: Secondary | ICD-10-CM | POA: Diagnosis not present

## 2022-09-25 DIAGNOSIS — G4733 Obstructive sleep apnea (adult) (pediatric): Secondary | ICD-10-CM | POA: Diagnosis not present

## 2022-09-29 DIAGNOSIS — E1169 Type 2 diabetes mellitus with other specified complication: Secondary | ICD-10-CM | POA: Diagnosis not present

## 2022-09-29 DIAGNOSIS — I10 Essential (primary) hypertension: Secondary | ICD-10-CM | POA: Diagnosis not present

## 2022-10-11 DIAGNOSIS — G4733 Obstructive sleep apnea (adult) (pediatric): Secondary | ICD-10-CM | POA: Diagnosis not present

## 2022-10-25 DIAGNOSIS — G4733 Obstructive sleep apnea (adult) (pediatric): Secondary | ICD-10-CM | POA: Diagnosis not present

## 2022-11-25 DIAGNOSIS — G4733 Obstructive sleep apnea (adult) (pediatric): Secondary | ICD-10-CM | POA: Diagnosis not present

## 2022-12-08 DIAGNOSIS — I10 Essential (primary) hypertension: Secondary | ICD-10-CM | POA: Diagnosis not present

## 2022-12-08 DIAGNOSIS — E1169 Type 2 diabetes mellitus with other specified complication: Secondary | ICD-10-CM | POA: Diagnosis not present

## 2023-01-10 DIAGNOSIS — G4733 Obstructive sleep apnea (adult) (pediatric): Secondary | ICD-10-CM | POA: Diagnosis not present

## 2023-01-22 DIAGNOSIS — E1169 Type 2 diabetes mellitus with other specified complication: Secondary | ICD-10-CM | POA: Diagnosis not present

## 2023-01-22 DIAGNOSIS — E782 Mixed hyperlipidemia: Secondary | ICD-10-CM | POA: Diagnosis not present

## 2023-01-22 DIAGNOSIS — E039 Hypothyroidism, unspecified: Secondary | ICD-10-CM | POA: Diagnosis not present

## 2023-01-22 DIAGNOSIS — Z125 Encounter for screening for malignant neoplasm of prostate: Secondary | ICD-10-CM | POA: Diagnosis not present

## 2023-01-22 DIAGNOSIS — R7989 Other specified abnormal findings of blood chemistry: Secondary | ICD-10-CM | POA: Diagnosis not present

## 2023-01-22 DIAGNOSIS — K219 Gastro-esophageal reflux disease without esophagitis: Secondary | ICD-10-CM | POA: Diagnosis not present

## 2023-02-02 DIAGNOSIS — G629 Polyneuropathy, unspecified: Secondary | ICD-10-CM | POA: Diagnosis not present

## 2023-02-02 DIAGNOSIS — E1169 Type 2 diabetes mellitus with other specified complication: Secondary | ICD-10-CM | POA: Diagnosis not present

## 2023-02-02 DIAGNOSIS — Z1331 Encounter for screening for depression: Secondary | ICD-10-CM | POA: Diagnosis not present

## 2023-02-02 DIAGNOSIS — I10 Essential (primary) hypertension: Secondary | ICD-10-CM | POA: Diagnosis not present

## 2023-02-02 DIAGNOSIS — Z1339 Encounter for screening examination for other mental health and behavioral disorders: Secondary | ICD-10-CM | POA: Diagnosis not present

## 2023-02-02 DIAGNOSIS — Z0000 Encounter for general adult medical examination without abnormal findings: Secondary | ICD-10-CM | POA: Diagnosis not present

## 2023-02-10 DIAGNOSIS — G4733 Obstructive sleep apnea (adult) (pediatric): Secondary | ICD-10-CM | POA: Diagnosis not present

## 2023-03-07 ENCOUNTER — Ambulatory Visit: Payer: BC Managed Care – PPO | Admitting: Podiatry

## 2023-03-07 ENCOUNTER — Encounter: Payer: Self-pay | Admitting: Podiatry

## 2023-03-07 DIAGNOSIS — B351 Tinea unguium: Secondary | ICD-10-CM

## 2023-03-07 DIAGNOSIS — M7751 Other enthesopathy of right foot: Secondary | ICD-10-CM | POA: Diagnosis not present

## 2023-03-07 DIAGNOSIS — E1149 Type 2 diabetes mellitus with other diabetic neurological complication: Secondary | ICD-10-CM

## 2023-03-07 DIAGNOSIS — E1140 Type 2 diabetes mellitus with diabetic neuropathy, unspecified: Secondary | ICD-10-CM | POA: Diagnosis not present

## 2023-03-07 DIAGNOSIS — M779 Enthesopathy, unspecified: Secondary | ICD-10-CM

## 2023-03-07 DIAGNOSIS — M7752 Other enthesopathy of left foot: Secondary | ICD-10-CM

## 2023-03-07 MED ORDER — GABAPENTIN 100 MG PO CAPS: 100.0000 mg | ORAL_CAPSULE | Freq: Three times a day (TID) | ORAL | 3 refills | Status: AC

## 2023-03-07 NOTE — Progress Notes (Signed)
Subjective:   Patient ID: Nathaniel Bonilla, male   DOB: 62 y.o.   MRN: [id number]   HPI Patient presents stating that he has had a lot of thickness of his big toenails both feet and they are difficult to cut and has had some darkness of the right big toenail that does have moderate neuropathy long-term history diabetes that was in relatively poor control now is in reasonably good control but still had a little ways to go with moderate obesity is complicating factor and patient does not smoke with pain in his feet in general   Review of Systems  All other systems reviewed and are negative.       Objective:  Physical Exam Vitals and nursing note reviewed.  Constitutional:      Appearance: He is well-developed.  Pulmonary:     Effort: Pulmonary effort is normal.  Musculoskeletal:        General: Normal range of motion.  Skin:    General: Skin is warm.  Neurological:     Mental Status: He is alert.     Neurovascular status intact muscle strength found to be adequate range of motion adequate with significant thickness of the big toenails of both feet with minimal discomfort with reduced vibratory and sharp dull sensation with just generalized foot pain also noted     Assessment:  Probability that part of this is related to neuropathic condition moderate obesity versus inflammatory tendinitis with nail trauma of the big toenails both feet     Plan:  H&P reviewed everything and I did discuss at great length trying to get better weight control and getting his A1c below 7 close to 6.  I then went ahead and casted for functional orthotics to try to lift his arch discussed shoe gear changes and patient will be seen back when orthotics return

## 2023-03-12 DIAGNOSIS — G4733 Obstructive sleep apnea (adult) (pediatric): Secondary | ICD-10-CM | POA: Diagnosis not present

## 2023-03-28 ENCOUNTER — Telehealth: Payer: Self-pay | Admitting: Podiatry

## 2023-03-28 NOTE — Telephone Encounter (Signed)
Lmom for pt to call back to schedule picking up orthotics

## 2023-04-12 DIAGNOSIS — G4733 Obstructive sleep apnea (adult) (pediatric): Secondary | ICD-10-CM | POA: Diagnosis not present

## 2023-04-19 NOTE — Telephone Encounter (Signed)
Lvm to schedule appt to p/u orthotics. ?

## 2023-05-12 DIAGNOSIS — G4733 Obstructive sleep apnea (adult) (pediatric): Secondary | ICD-10-CM | POA: Diagnosis not present

## 2023-06-12 DIAGNOSIS — G4733 Obstructive sleep apnea (adult) (pediatric): Secondary | ICD-10-CM | POA: Diagnosis not present

## 2023-06-15 ENCOUNTER — Other Ambulatory Visit: Payer: BC Managed Care – PPO

## 2023-06-15 ENCOUNTER — Other Ambulatory Visit (INDEPENDENT_AMBULATORY_CARE_PROVIDER_SITE_OTHER): Payer: BC Managed Care – PPO

## 2023-06-15 ENCOUNTER — Ambulatory Visit: Payer: BC Managed Care – PPO | Admitting: Orthopedic Surgery

## 2023-06-15 DIAGNOSIS — M25511 Pain in right shoulder: Secondary | ICD-10-CM

## 2023-06-16 ENCOUNTER — Encounter: Payer: Self-pay | Admitting: Orthopedic Surgery

## 2023-06-16 NOTE — Progress Notes (Signed)
Office Visit Note   Patient: Nathaniel Bonilla           Date of Birth: 19-Nov-1960           MRN: [id number] Visit Date: 06/15/2023 Requested by: Chilton Greathouse, MD [address] [place],  [place] [address] PCP: Chilton Greathouse, MD  Subjective: Chief Complaint  Patient presents with   Right Shoulder - Pain    HPI: Nathaniel Bonilla is a 62 y.o. male who presents to the office reporting right shoulder pain of 1 year duration.  He also reports milder left shoulder pain.  Pain is primarily anterior.  Does have a little bit of numbness and tingling in the fingers if he lays on his back.  Has a history of right shoulder stabilization in 2016.  Takes Advil with relief.  Denies any scapular pain.  Does have a little pain when he is getting up from a low chair.  Has milder pain with driving.  He is able to sleep on the right-hand side.  Shoulder has not been unstable..                ROS: All systems reviewed are negative as they relate to the chief complaint within the history of present illness.  Patient denies fevers or chills.  Assessment & Plan: Visit Diagnoses:  1. Right shoulder pain, unspecified chronicity     Plan: Impression is right shoulder early arthritis without much loss of motion.  Could consider glenohumeral joint injection if symptoms worsen.  Rotator cuff strength is good but at the time of his surgical stabilization he did have some partial-thickness tearing of the rotator cuff.  Primarily the supraspinatus.  For now we are going to observe.  Cautioned him against doing too much in terms of heavy benchpress and heavy weightlifting that puts a compressive force across the glenohumeral joint.  He will follow-up as needed.  Follow-Up Instructions: No follow-ups on file.   Orders:  Orders Placed This Encounter  Procedures   XR Shoulder Right   No orders of the defined types were placed in this encounter.     Procedures: No procedures performed   Clinical Data: No  additional findings.  Objective: Vital Signs: There were no vitals taken for this visit.  Physical Exam:  Constitutional: Patient appears well-developed HEENT:  Head: Normocephalic Eyes:EOM are normal Neck: Normal range of motion Cardiovascular: Normal rate Pulmonary/chest: Effort normal Neurologic: Patient is alert Skin: Skin is warm Psychiatric: Patient has normal mood and affect  Ortho Exam: Ortho exam demonstrates good cervical spine range of motion.  5 out of 5 grip EPL FPL interosseous resection extension bicep triceps and deltoid strength.  Radial pulse intact bilaterally.  He has bilateral passive shoulder range of motion of 60/95/170.  No apprehension.  Good rotator cuff strength to infraspinatus supraspinatus and subscap muscle testing without any coarseness or grinding with internal and external rotation.  No soft tissue grinding and no bone-on-bone type grinding in the shoulder with passive range of motion.  Specialty Comments:  No specialty comments available.  Imaging: No results found.   PMFS History: Patient Active Problem List   Diagnosis Date Noted   URI 01/07/2009   SLEEP APNEA, OBSTRUCTIVE 01/06/2008   SHOULDER PAIN, RIGHT 01/06/2008   DIABETES MELLITUS, TYPE II, BORDERLINE 01/06/2008   NEOPLASM, MALIGNANT, TESTES, HX OF 01/06/2008   Past Medical History:  Diagnosis Date   Allergy    seasonal   Diabetes mellitus without complication (HCC)    Malignant  neoplasm of other and unspecified testis 2005   testicular   Sleep apnea    cpap   Thyroid disease     Family History  Problem Relation Age of Onset   Glaucoma Mother    Stroke Father    Stroke Other    Glaucoma Other    Colon cancer Neg Hx    Esophageal cancer Neg Hx    Rectal cancer Neg Hx    Stomach cancer Neg Hx     Past Surgical History:  Procedure Laterality Date   COLONOSCOPY  11/24/2016   Dr.Nandigam   POLYPECTOMY     SHOULDER ARTHROSCOPY WITH LABRAL REPAIR Right 12/28/2014    Procedure: SHOULDER ARTHROSCOPY WITH LABRAL REPAIR, GELENOID FIXATION;  Surgeon: Cammy Copa, MD;  Location: [facility] OR;  Service: Orthopedics;  Laterality: Right;  RIGHT SHOULDER ARTHROSCOPY, GLENOID FIXATION, LABRAL REPAIR.   TESTICLE REMOVAL Left    WISDOM TOOTH EXTRACTION     Social History   Occupational History   Not on file  Tobacco Use   Smoking status: Never   Smokeless tobacco: Never  Vaping Use   Vaping status: Never Used  Substance and Sexual Activity   Alcohol use: No    Alcohol/week: 0.0 standard drinks of alcohol   Drug use: No   Sexual activity: Not on file

## 2023-07-05 ENCOUNTER — Other Ambulatory Visit: Payer: BC Managed Care – PPO

## 2023-07-12 DIAGNOSIS — G4733 Obstructive sleep apnea (adult) (pediatric): Secondary | ICD-10-CM | POA: Diagnosis not present

## 2023-07-13 DIAGNOSIS — E1169 Type 2 diabetes mellitus with other specified complication: Secondary | ICD-10-CM | POA: Diagnosis not present

## 2023-07-13 DIAGNOSIS — G4733 Obstructive sleep apnea (adult) (pediatric): Secondary | ICD-10-CM | POA: Diagnosis not present

## 2023-07-13 DIAGNOSIS — I10 Essential (primary) hypertension: Secondary | ICD-10-CM | POA: Diagnosis not present

## 2023-07-13 DIAGNOSIS — Z23 Encounter for immunization: Secondary | ICD-10-CM | POA: Diagnosis not present

## 2023-08-02 ENCOUNTER — Telehealth: Payer: Self-pay | Admitting: Podiatry

## 2023-08-02 NOTE — Telephone Encounter (Signed)
Called the pt to let him know that his orthotics are in and for him to call the office to get scheduled.

## 2023-11-23 DIAGNOSIS — E1169 Type 2 diabetes mellitus with other specified complication: Secondary | ICD-10-CM | POA: Diagnosis not present

## 2023-11-23 DIAGNOSIS — I10 Essential (primary) hypertension: Secondary | ICD-10-CM | POA: Diagnosis not present

## 2024-01-28 DIAGNOSIS — Z1212 Encounter for screening for malignant neoplasm of rectum: Secondary | ICD-10-CM | POA: Diagnosis not present

## 2024-01-28 DIAGNOSIS — E782 Mixed hyperlipidemia: Secondary | ICD-10-CM | POA: Diagnosis not present

## 2024-01-28 DIAGNOSIS — E1169 Type 2 diabetes mellitus with other specified complication: Secondary | ICD-10-CM | POA: Diagnosis not present

## 2024-01-28 DIAGNOSIS — E039 Hypothyroidism, unspecified: Secondary | ICD-10-CM | POA: Diagnosis not present

## 2024-01-28 DIAGNOSIS — Z125 Encounter for screening for malignant neoplasm of prostate: Secondary | ICD-10-CM | POA: Diagnosis not present

## 2024-01-28 DIAGNOSIS — G4733 Obstructive sleep apnea (adult) (pediatric): Secondary | ICD-10-CM | POA: Diagnosis not present

## 2024-02-08 DIAGNOSIS — I10 Essential (primary) hypertension: Secondary | ICD-10-CM | POA: Diagnosis not present

## 2024-02-08 DIAGNOSIS — Z1331 Encounter for screening for depression: Secondary | ICD-10-CM | POA: Diagnosis not present

## 2024-02-08 DIAGNOSIS — E1169 Type 2 diabetes mellitus with other specified complication: Secondary | ICD-10-CM | POA: Diagnosis not present

## 2024-02-08 DIAGNOSIS — Z0000 Encounter for general adult medical examination without abnormal findings: Secondary | ICD-10-CM | POA: Diagnosis not present

## 2024-02-08 DIAGNOSIS — R82998 Other abnormal findings in urine: Secondary | ICD-10-CM | POA: Diagnosis not present

## 2024-02-08 DIAGNOSIS — Z1339 Encounter for screening examination for other mental health and behavioral disorders: Secondary | ICD-10-CM | POA: Diagnosis not present

## 2024-02-27 DIAGNOSIS — G4733 Obstructive sleep apnea (adult) (pediatric): Secondary | ICD-10-CM | POA: Diagnosis not present

## 2024-03-21 ENCOUNTER — Telehealth: Payer: Self-pay

## 2024-03-21 NOTE — Telephone Encounter (Signed)
 LVM orthotics are still here.. third time trying to reach pt. Schedule PU  Balance: [phone]

## 2024-03-29 DIAGNOSIS — G4733 Obstructive sleep apnea (adult) (pediatric): Secondary | ICD-10-CM | POA: Diagnosis not present

## 2024-07-20 DIAGNOSIS — Z23 Encounter for immunization: Secondary | ICD-10-CM | POA: Diagnosis not present
